# Patient Record
Sex: Male | Born: 1935 | Race: White | Hispanic: No | Smoking: Former smoker
Health system: Southern US, Community
[De-identification: ages and names within clinical notes are randomized; demographics above are authoritative.]

## PROBLEM LIST (undated history)

## (undated) DIAGNOSIS — R238 Other skin changes: Secondary | ICD-10-CM

## (undated) DIAGNOSIS — E785 Hyperlipidemia, unspecified: Secondary | ICD-10-CM

## (undated) DIAGNOSIS — I8390 Asymptomatic varicose veins of unspecified lower extremity: Secondary | ICD-10-CM

## (undated) DIAGNOSIS — D61818 Other pancytopenia: Principal | ICD-10-CM

## (undated) DIAGNOSIS — D469 Myelodysplastic syndrome, unspecified: Secondary | ICD-10-CM

## (undated) DIAGNOSIS — D4620 Refractory anemia with excess of blasts, unspecified: Principal | ICD-10-CM

## (undated) DIAGNOSIS — C61 Malignant neoplasm of prostate: Secondary | ICD-10-CM

## (undated) HISTORY — DX: Other skin changes: R23.8

## (undated) HISTORY — DX: Hyperlipidemia, unspecified: E78.5

## (undated) HISTORY — DX: Other pancytopenia: D61.818

## (undated) HISTORY — PX: PROSTATECTOMY: SHX69

## (undated) HISTORY — DX: Asymptomatic varicose veins of unspecified lower extremity: I83.90

## (undated) HISTORY — DX: Malignant neoplasm of prostate: C61

## (undated) HISTORY — DX: Refractory anemia with excess of blasts, unspecified: D46.20

---

## 1995-12-20 HISTORY — PX: PROSTATE SURGERY: SHX751

## 1999-04-26 ENCOUNTER — Ambulatory Visit (HOSPITAL_COMMUNITY): Admission: RE | Admit: 1999-04-26 | Discharge: 1999-04-26 | Payer: Self-pay

## 2004-07-03 ENCOUNTER — Encounter: Admission: RE | Admit: 2004-07-03 | Discharge: 2004-07-26 | Payer: Self-pay | Admitting: Orthopedic Surgery

## 2007-09-16 ENCOUNTER — Encounter: Admission: RE | Admit: 2007-09-16 | Discharge: 2007-09-16 | Payer: Self-pay | Admitting: Internal Medicine

## 2009-12-19 HISTORY — PX: HERNIA REPAIR: SHX51

## 2011-01-12 ENCOUNTER — Encounter (INDEPENDENT_AMBULATORY_CARE_PROVIDER_SITE_OTHER): Payer: Self-pay | Admitting: Surgery

## 2011-01-12 ENCOUNTER — Ambulatory Visit (INDEPENDENT_AMBULATORY_CARE_PROVIDER_SITE_OTHER): Payer: Medicare Other | Admitting: Surgery

## 2011-01-12 VITALS — BP 132/76 | HR 69 | Ht 69.0 in | Wt 157.4 lb

## 2011-01-12 DIAGNOSIS — K644 Residual hemorrhoidal skin tags: Secondary | ICD-10-CM

## 2011-01-12 MED ORDER — HYDROCODONE-ACETAMINOPHEN 10-325 MG PO TABS: 1.0000 | ORAL_TABLET | Freq: Four times a day (QID) | ORAL | Status: AC | PRN

## 2011-01-12 NOTE — Progress Notes (Signed)
Subjective:     Patient ID: Jason Stafford, male   DOB: 07-02-1928, 77 y.o.   MRN: [id number]  HPI Patient presents today due to skin tag. This is located in his anal canal. It has been present for a number of months. It is causing local irritation. It causes mild to moderate discomfort with bowel movements. He denies any bleeding.  Review of Systems  Constitutional: Negative.   Eyes: Negative.   Respiratory: Negative.   Cardiovascular: Negative.   Gastrointestinal: Negative.   Genitourinary: Negative.   Musculoskeletal: Negative.        Objective:   Physical Exam  Constitutional: He appears well-developed and well-nourished.  HENT:  Head: Normocephalic and atraumatic.  Nose: Nose normal.  Genitourinary:       Skin tag noted left anal canal. Mild irritation. No anal fissure or hemorrhoid disease. Rectal tone normal. No masses.       Assessment:     Anal skin tag    Plan:     The anal skin tag is bothering him. He wishes to have it removed. Risks include bleeding, infection, and recurrence. He understands and agrees. Under sterile conditions using one percent lidocaine skin tag was left anal canal was injected. This was excised with scissors. Dry dressing was applied. Hemostasis achieved. He will return as needed or if he develops fever or increasing pain. Care instructions given.

## 2011-01-12 NOTE — Patient Instructions (Signed)
Sitz baths 3 times per day.  Keep area dry with gauze.  Call if temp greater than 101.5 or increasing pain.  Baby wipes.

## 2012-01-17 ENCOUNTER — Encounter: Payer: Self-pay | Admitting: Oncology

## 2012-01-17 ENCOUNTER — Other Ambulatory Visit: Payer: Self-pay | Admitting: Oncology

## 2012-01-17 DIAGNOSIS — C61 Malignant neoplasm of prostate: Secondary | ICD-10-CM

## 2012-01-17 DIAGNOSIS — D61818 Other pancytopenia: Secondary | ICD-10-CM

## 2012-01-17 DIAGNOSIS — E785 Hyperlipidemia, unspecified: Secondary | ICD-10-CM | POA: Insufficient documentation

## 2012-01-17 HISTORY — DX: Malignant neoplasm of prostate: C61

## 2012-01-17 HISTORY — DX: Hyperlipidemia, unspecified: E78.5

## 2012-01-17 HISTORY — DX: Other pancytopenia: D61.818

## 2012-01-18 ENCOUNTER — Telehealth: Payer: Self-pay | Admitting: Oncology

## 2012-01-18 NOTE — Telephone Encounter (Signed)
S/w the pt and he is aware of his lab appt on 01/28/2012 °

## 2012-01-22 ENCOUNTER — Telehealth: Payer: Self-pay | Admitting: Oncology

## 2012-01-22 NOTE — Telephone Encounter (Signed)
S/w the pt and he is aware of his lab appt on 01/28/2012

## 2012-01-24 ENCOUNTER — Telehealth: Payer: Self-pay | Admitting: Oncology

## 2012-01-24 NOTE — Telephone Encounter (Signed)
S/W pt in re NP appt 10/02 @ 3 w/Dr. Cyndie Chime.  Referring Dr. Selena Batten Dx- Pancytopenia NP packet mailed.

## 2012-01-24 NOTE — Telephone Encounter (Signed)
C/D on 9/5 for appt 10/02

## 2012-01-28 ENCOUNTER — Other Ambulatory Visit (HOSPITAL_BASED_OUTPATIENT_CLINIC_OR_DEPARTMENT_OTHER): Payer: Medicare Other | Admitting: Lab

## 2012-01-28 DIAGNOSIS — D61818 Other pancytopenia: Secondary | ICD-10-CM

## 2012-01-28 LAB — COMPREHENSIVE METABOLIC PANEL (CC13)
AST: 22 U/L (ref 5–34)
Albumin: 3.7 g/dL (ref 3.5–5.0)
Alkaline Phosphatase: 72 U/L (ref 40–150)
BUN: 18 mg/dL (ref 7.0–26.0)
Creatinine: 0.8 mg/dL (ref 0.7–1.3)
Glucose: 86 mg/dl (ref 70–99)

## 2012-01-28 LAB — CBC & DIFF AND RETIC
Basophils Absolute: 0 10*3/uL (ref 0.0–0.1)
Eosinophils Absolute: 0 10*3/uL (ref 0.0–0.5)
HCT: 36.6 % — ABNORMAL LOW (ref 38.4–49.9)
HGB: 12.3 g/dL — ABNORMAL LOW (ref 13.0–17.1)
Immature Retic Fract: 14.9 % — ABNORMAL HIGH (ref 3.00–10.60)
LYMPH%: 32.1 % (ref 14.0–49.0)
MCV: 89.5 fL (ref 79.3–98.0)
MONO#: 0.5 10*3/uL (ref 0.1–0.9)
MONO%: 12 % (ref 0.0–14.0)
NEUT#: 2.1 10*3/uL (ref 1.5–6.5)
NEUT%: 54.9 % (ref 39.0–75.0)
Platelets: 84 10*3/uL — ABNORMAL LOW (ref 140–400)
RBC: 4.09 10*6/uL — ABNORMAL LOW (ref 4.20–5.82)

## 2012-01-28 LAB — MORPHOLOGY: PLT EST: DECREASED

## 2012-01-30 LAB — IMMUNOFIXATION ELECTROPHORESIS
IgA: 188 mg/dL (ref 68–379)
IgG (Immunoglobin G), Serum: 1030 mg/dL (ref 650–1600)

## 2012-01-30 LAB — ERYTHROPOIETIN: Erythropoietin: 36.9 m[IU]/mL — ABNORMAL HIGH (ref 2.6–34.0)

## 2012-01-30 LAB — KAPPA/LAMBDA LIGHT CHAINS
Kappa free light chain: 1.03 mg/dL (ref 0.33–1.94)
Lambda Free Lght Chn: 2.73 mg/dL — ABNORMAL HIGH (ref 0.57–2.63)

## 2012-01-30 LAB — FOLATE: Folate: >20 ng/mL

## 2012-01-30 LAB — VITAMIN B12: Vitamin B-12: 593 pg/mL (ref 211–911)

## 2012-02-07 ENCOUNTER — Other Ambulatory Visit: Payer: Self-pay | Admitting: Oncology

## 2012-02-20 ENCOUNTER — Telehealth: Payer: Self-pay | Admitting: Oncology

## 2012-02-20 ENCOUNTER — Ambulatory Visit (HOSPITAL_BASED_OUTPATIENT_CLINIC_OR_DEPARTMENT_OTHER): Payer: Medicare Other | Admitting: Oncology

## 2012-02-20 ENCOUNTER — Ambulatory Visit: Payer: Medicare Other

## 2012-02-20 ENCOUNTER — Encounter: Payer: Self-pay | Admitting: Oncology

## 2012-02-20 VITALS — BP 133/72 | Ht 69.0 in | Wt 156.0 lb

## 2012-02-20 DIAGNOSIS — D61818 Other pancytopenia: Secondary | ICD-10-CM

## 2012-02-20 DIAGNOSIS — D72821 Monocytosis (symptomatic): Secondary | ICD-10-CM

## 2012-02-20 NOTE — Progress Notes (Signed)
New Patient Hematology-Oncology Evaluation   Jason Stafford [id number] 09/25/1928 78 y.o. 02/20/2012  CC: Dr. Pearson Grippe   Reason for referral: Pancytopenia   HPI:  Pleasant man who will be 78 years old next week. He has been in overall excellent health without any major medical problems. He had a prostatectomy in 1997 for prostate cancer. He did not require any radiation treatments. His only medications are one baby aspirin once a week,  a multivitamin  and a calcium supplement.Marland Kitchen He drinks 2 glasses of red wine per week. He has no history of exposure to organic chemicals or therapeutic radiation. He feels great. He is very active. A routine CBC done through his primary care physician's office on August 27  when he complained of spontaneous bruising showed mild pancytopenia with hemoglobin 12.5 hematocrit 37 MCV 94 white count 4200, 54% neutrophils, 32% lymphocytes, 12% monocytes, 2 eosinophils, and platelet count 76,000. Lab was repeated in anticipation of today's visit on September 9 and shows similar values. In addition I checked a B12, folic acid, both normal at 409 and greater than 20 respectively.. Serum immunoglobulins with normal total IgG, IgA, and slightly decreased IgM. There is a monoclonal IgA lambda paraprotein on immunofixation electrophoresis. Serum  lambda free light chains minimally increased over normal at 2.73 mg percent normal up to 2.63 and this is insignificant. A serum erythropoietin level is 36.9 slightly above normal range up to 34. Chemistry profile shows a decreased serum total protein 6.1 with a normal albumin of 3.7. Liver functions otherwise normal. Renal function normal.   PMH: Past Medical History  Diagnosis Date  . Pancytopenia 01/17/2012    Hb 12.5, MCV 94, WBC 4,200 54 P, 32 L , 12 Mono, platelets 75,000  01/15/12  . Prostate ca 01/17/2012    Gleason 7, Rx prostatectomy Aug, 1997  . Hyperlipidemia 01/17/2012  No history of hypertension, MI, ulcers, asthma,  emphysema, hepatitis, yellow jaundice, thyroid trouble, seizures, stroke, blood clots, bleeding problems.  Past Surgical History  Procedure Date  . Prostate surgery aug 1997    cancer  . Hernia repair aug 2011    Allergies: Allergies  Allergen Reactions  . 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 Former smoker who stopped back in the 70s. Alcohol intake Limited to 2 glasses of wine weekly. He never drank heavily in the past. He is married and wife accompanies him today. Family History: Family History  Problem Relation Age of Onset  . Heart disease Mother   . Heart disease Father     Review of Systems: Constitutional symptoms:no constitutional symptoms HEENT: Respiratory: no cough or dyspnea Cardiovascular:  No chest pain or palpitations Gastrointestinal ROS: no change in bowel habit Genito-Urinary ROS: he has problems with urinary dribbling since prostate surgery. He required a urologic surgery at [facility] with placement of a mesh sling. Hematological and Lymphatic: Musculoskeletal:no muscle or bone pain Neurologic:no headache or change in vision Dermatologic:see above Remaining ROS negative.  Physical Exam: Blood pressure 133/72, height 5\' 9"  (1.753 m), weight 156 lb (70.761 kg). Wt Readings from Last 3 Encounters:  02/20/12 156 lb (70.761 kg)  01/12/11 157 lb 6.4 oz (71.396 kg)    General appearance:and well-nourished Caucasian man Head: normal, pharynx no erythema exudate or mass Neck: normal Lymph nodes:no adenopathy Breasts: Lungs:clinical auscultation resonant to percussion Heart:regular rhythm no murmur Abdominal:soft nontender no  mass no organomegaly GU:not examined Extremities:no edema no calf tenderness Neurologic:moderate decrease in vibration sensation over the fingertips by tuning fork exam Skin:some tiny 3-4 mm  bruises on his left arm    Lab Results: Lab Results  Component Value Date   WBC 3.8* 01/28/2012   HGB 12.3* 01/28/2012   HCT 36.6* 01/28/2012   MCV 89.5 01/28/2012   PLT 84* 01/28/2012     Chemistry      Component Value Date/Time   NA 142 01/28/2012 1501   K 4.0 01/28/2012 1501   CL 107 01/28/2012 1501   CO2 28 01/28/2012 1501   BUN 18.0 01/28/2012 1501   CREATININE 0.8 01/28/2012 1501      Component Value Date/Time   CALCIUM 8.5 01/28/2012 1501   ALKPHOS 72 01/28/2012 1501   AST 22 01/28/2012 1501   ALT 17 01/28/2012 1501   BILITOT 0.50 01/28/2012 1501       Review of peripheral blood film:normochromic normocytic red cells. Neutrophils and lymphocytes are mature. Normal platelets. No early myeloid form    Impression and Plan: Pancytopenia with monocytosis in an otherwise healthy man on no major medications.  Most likely diagnosis is a myelodysplastic syndrome. Multiple myeloma not excluded but less likely.  Plan:  I discussed the  Blood findings in detail with the patient his wife and gave them written notes as we talked. He has agreed to a bone marrow aspiration and biopsy which I will do on October 17 as an outpatient. We discussed the fact that if this is a myelodysplastic syndrome, bone marrow yields important information. Based on percentage of blasts and any chromosome changes we can stratify patients into low,  Intermediate, or high risk for progression of pancytopenia and the development of acute leukemia. We discussed potential treatments for each of these categories. If he is in the low risk category, observation alone would be the treatment of choice.      Levert Feinstein, MD 02/20/2012, 4:57 PM

## 2012-02-20 NOTE — Telephone Encounter (Signed)
gv and printed appt for pt......Jason Kitchenemailed MW and LT to add appt for pt bone marrow biopsy 10.17.13 @ 9:00 am.

## 2012-02-20 NOTE — Progress Notes (Signed)
Checked in new pt with no financial concerns. °

## 2012-02-21 ENCOUNTER — Telehealth: Payer: Self-pay | Admitting: Oncology

## 2012-02-21 NOTE — Telephone Encounter (Signed)
sw pt's wife and advised on 10.17.13 and 10.29.13 appts...Marland KitchenMarland KitchenPA LT added Bone marrow appt @ 9:00....sed

## 2012-02-28 ENCOUNTER — Telehealth: Payer: Self-pay | Admitting: *Deleted

## 2012-02-28 NOTE — Telephone Encounter (Signed)
Pt called & states that he wants to cancel his BMBX appt for 03/06/12 but will keep 03/18/12 appt with Dr. Cyndie Chime.  He feels that he needs to discuss this more with the doctor.  Cancelled appt with infusion/Michelle & called [name] & he didn't have him on their list.  Dr. Cyndie Chime will be notified.

## 2012-03-06 ENCOUNTER — Other Ambulatory Visit: Payer: Medicare Other | Admitting: Lab

## 2012-03-18 ENCOUNTER — Ambulatory Visit (HOSPITAL_BASED_OUTPATIENT_CLINIC_OR_DEPARTMENT_OTHER): Payer: Medicare Other | Admitting: Oncology

## 2012-03-18 ENCOUNTER — Other Ambulatory Visit (HOSPITAL_BASED_OUTPATIENT_CLINIC_OR_DEPARTMENT_OTHER): Payer: Medicare Other

## 2012-03-18 VITALS — BP 113/71 | HR 72 | Temp 97.7°F | Resp 18 | Ht 69.0 in | Wt 162.2 lb

## 2012-03-18 DIAGNOSIS — D72821 Monocytosis (symptomatic): Secondary | ICD-10-CM

## 2012-03-18 DIAGNOSIS — D61818 Other pancytopenia: Secondary | ICD-10-CM

## 2012-03-18 LAB — CBC WITH DIFFERENTIAL/PLATELET
Basophils Absolute: 0 10*3/uL (ref 0.0–0.1)
Eosinophils Absolute: 0.1 10*3/uL (ref 0.0–0.5)
HGB: 11.2 g/dL — ABNORMAL LOW (ref 13.0–17.1)
MCV: 89 fL (ref 79.3–98.0)
MONO#: 0.3 10*3/uL (ref 0.1–0.9)
NEUT#: 1.7 10*3/uL (ref 1.5–6.5)
Platelets: 98 10*3/uL — ABNORMAL LOW (ref 140–400)
RBC: 3.72 10*6/uL — ABNORMAL LOW (ref 4.20–5.82)
RDW: 20.6 % — ABNORMAL HIGH (ref 11.0–14.6)
WBC: 3.5 10*3/uL — ABNORMAL LOW (ref 4.0–10.3)

## 2012-03-18 NOTE — Patient Instructions (Signed)
Bone marrow biopsy at [facility]  Arrive at 8:30 AM for lab, 9 AM for procedure on Thursday, November 14 Take ativan 1 mg 30-45 minutes before procedure OK to have a light breakfast Meet with MD to discuss results on Friday, 11/29 @ 4 PM

## 2012-03-18 NOTE — Progress Notes (Signed)
Short interim followup visit for this pleasant 78 year old man I recently evaluated for unexplained pancytopenia. Please see my October 2 office consult note for full details. Screen for multiple myeloma was negative. B12 and folic acid levels normal. He had a mild monocytosis. My working diagnosis was a myelodysplastic syndrome. I recommended a bone marrow aspiration and biopsy. I scheduled this but then he called and canceled it. He had more questions and came in with his wife today for further discussion. We again reviewed the fact that myelodysplastic syndrome is a diagnosis that includes many different bone marrow disorders. It can be best characterized by risk categories of low, intermediate, and high with respect to progressive cytopenias and incidence of conversion to acute leukemia. The only way to get this information is to look at percent blasts in the bone marrow and chromosome changes. Low risk MDS is treated with observation alone. For isolated anemia, ESAs can be used with good results in many patients. For intermediate risk patients especially those with the 5Q chromosome deletion as a single cytogenetic abnormality, Revlimid can be used with good results. For high risk patients, we typically use a hypomethylating agent such as 5-azacytidine which is given by subcutaneous injection 7 days each month. He was particularly interested in side effects and quality of life issues and I tried to answer his questions as best as I could reminding him that I really need a firm diagnosis to make any specific treatment recommendations.  He and his wife have a much better understanding today. We concluded with him rescheduling a bone marrow biopsy which I will do on November 14.

## 2012-03-19 ENCOUNTER — Telehealth: Payer: Self-pay | Admitting: Oncology

## 2012-03-19 ENCOUNTER — Telehealth: Payer: Self-pay | Admitting: *Deleted

## 2012-03-19 ENCOUNTER — Encounter: Payer: Self-pay | Admitting: *Deleted

## 2012-03-19 NOTE — Progress Notes (Signed)
Bone Marrow BX scheduled for Thursday 04/04/11.  8:30am lab/9am Infusion Marcelino Duster).   Scheduled with Claris Che in Flow [phone].

## 2012-03-19 NOTE — Telephone Encounter (Signed)
lmonvm for pt confirming appts for 11/14 and 11/29.

## 2012-03-19 NOTE — Telephone Encounter (Signed)
Patient confirmed over the phone the new date and time on 04-18-2012 at 11:30am

## 2012-03-21 ENCOUNTER — Telehealth: Payer: Self-pay | Admitting: Oncology

## 2012-03-21 ENCOUNTER — Other Ambulatory Visit: Payer: Self-pay | Admitting: *Deleted

## 2012-03-21 NOTE — Telephone Encounter (Signed)
Pt called and wanted to move appt to the afternoon. Moved appt to 4:30.

## 2012-04-03 ENCOUNTER — Ambulatory Visit (HOSPITAL_BASED_OUTPATIENT_CLINIC_OR_DEPARTMENT_OTHER): Payer: Medicare Other | Admitting: Oncology

## 2012-04-03 ENCOUNTER — Other Ambulatory Visit: Payer: Medicare Other | Admitting: Lab

## 2012-04-03 ENCOUNTER — Other Ambulatory Visit (HOSPITAL_COMMUNITY)
Admission: RE | Admit: 2012-04-03 | Discharge: 2012-04-03 | Disposition: A | Discharge status: Home / Self Care | Payer: Medicare Other | Source: Ambulatory Visit | Attending: Oncology | Admitting: Oncology

## 2012-04-03 VITALS — BP 117/66 | HR 77 | Temp 97.5°F

## 2012-04-03 DIAGNOSIS — D61818 Other pancytopenia: Secondary | ICD-10-CM

## 2012-04-03 LAB — DIFFERENTIAL
Basophils Relative: 0 % (ref 0–1)
Eosinophils Relative: 1 % (ref 0–5)
Lymphs Abs: 1.2 10*3/uL (ref 0.7–4.0)
Monocytes Absolute: 0.3 10*3/uL (ref 0.1–1.0)
Monocytes Relative: 9 % (ref 3–12)
Neutro Abs: 1.5 10*3/uL — ABNORMAL LOW (ref 1.7–7.7)

## 2012-04-03 LAB — CBC
HCT: 35.2 % — ABNORMAL LOW (ref 39.0–52.0)
Hemoglobin: 11.5 g/dL — ABNORMAL LOW (ref 13.0–17.0)
MCH: 28.4 pg (ref 26.0–34.0)
MCV: 86.9 fL (ref 78.0–100.0)
RBC: 4.05 MIL/uL — ABNORMAL LOW (ref 4.22–5.81)
WBC: 3 10*3/uL — ABNORMAL LOW (ref 4.0–10.5)

## 2012-04-03 NOTE — Progress Notes (Signed)
Bone Marrow Biopsy and Aspiration Procedure Note   Informed consent was obtained and potential risks including bleeding, infection and pain were reviewed with the patient.  Posterior iliac crest(s) prepped with Betadine.  Lidocaine 2% local anesthesia infiltrated into the subcutaneous tissue. Premedication: PO lorazepam 1 mg  Left bone marrow biopsy and  aspirate was obtained.   The procedure was tolerated well and there were no complications.  Specimens sent for: routine histopathologic stains and sectioning,  and cytogenetics  Indication:  Evaluate pancytopenia  Physician: Levert Feinstein

## 2012-04-03 NOTE — Patient Instructions (Addendum)
[facility] Discharge Instructions for Post Bone Marrow Procedure  Today you had a bone marrow biopsy and aspirate of left hip.   Please keep the pressure dressing in place for at least 24 hours.  Have someone check your dressing periodically for bleeding.  If needed you can reapply a pressure dressing to the site.  Take pain medication as ordered per MD.  IF BLEEDING REOCCURS THAT SHOULD BE REPORTED IMMEDIATELY. Call the Cancer Center at [phone] if during business hours. Or report to the Emergency Room.   I have been informed and understand all the instructions given to me. I know to contact the clinic, my physician, or go to the Emergency Department if any problems should occur. I do not have any questions at this time, but understand that I may call the clinic during office hours at [phone]  should I have any questions or need assistance in obtaining follow up care.    __________________________________________  _____________  __________ Signature of Patient or Authorized Representative            Date                   Time    __________________________________________ Nurse's Signature     

## 2012-04-03 NOTE — Progress Notes (Signed)
0930-BM biopsy complete to left hip per Dr. Cyndie Chime.  VS stable.  Dressing clean, dry and intact to left hip.  Teach back complete regarding BM biopsy discharge instructions.  Pt with no questions at this time.

## 2012-04-08 ENCOUNTER — Other Ambulatory Visit: Payer: Self-pay | Admitting: *Deleted

## 2012-04-08 MED ORDER — LORAZEPAM 1 MG PO TABS: ORAL_TABLET | ORAL | Status: DC

## 2012-04-18 ENCOUNTER — Ambulatory Visit (HOSPITAL_BASED_OUTPATIENT_CLINIC_OR_DEPARTMENT_OTHER): Payer: Medicare Other | Admitting: Oncology

## 2012-04-18 ENCOUNTER — Encounter: Payer: Self-pay | Admitting: Oncology

## 2012-04-18 VITALS — BP 124/65 | HR 75 | Temp 98.4°F | Resp 18 | Ht 69.0 in | Wt 161.1 lb

## 2012-04-18 DIAGNOSIS — D61818 Other pancytopenia: Secondary | ICD-10-CM

## 2012-04-18 DIAGNOSIS — D46Z Other myelodysplastic syndromes: Secondary | ICD-10-CM

## 2012-04-18 DIAGNOSIS — D4620 Refractory anemia with excess of blasts, unspecified: Secondary | ICD-10-CM

## 2012-04-18 HISTORY — DX: Other myelodysplastic syndromes: D46.Z

## 2012-04-18 HISTORY — DX: Refractory anemia with excess of blasts, unspecified: D46.20

## 2012-04-18 NOTE — Progress Notes (Signed)
Mr. Allinson here with his wife today to discuss results of bone marrow biopsy I did last week. He is under evaluation for pancytopenia.  Bone marrow biopsy is hypercellular for age with trilineage dyspoiesis although this is mild in the erythroid series and myeloid series but more pronounced in the megakaryocyte line. 500 cell count shows 5% blasts which is upper normal. Cytogenetic studies including routine chromosomes and FISH analysis for chromosome 5 and chromosomes 7 abnormalities are normal.  We discussed the international prognostic scoring system. I gave him extensive printed information about myelodysplastic syndrome. At present, he has a low risk score. This would predict a very low conversion to acute leukemia on the order of 25% in the next 11 years.  Impression: Low risk myelodysplastic syndrome.   Recommendation: I wanted to check lab every 2 months but he would prefer to check every 3 months. We will check again after the new year. If any significant trend for deterioration in his counts, I will start him on oral Revlimid 10 mg daily. We discussed this drug today along with potential side effects. There is about a 30% chance it would work in people who do not have an isolated 5Q minus syndrome where the response rates are 50% or higher. He is not a candidate for more aggressive therapy at this point in time.  The entire visit was spent with face-to-face consultation with the patient and his wife. Physical exam not repeated today nor necessary.  He did develop a significant hematoma at the site of the bone marrow biopsy. He does not take aspirin. Despite the fact that his platelet count is currently 122,000, it appears that his platelets are dysfunctional. He also showed me some spontaneous ecchymoses on his left forearm. He is advised to stay away from aspirin products.   Cc: Dr. Pearson Grippe; Dr. Harriette Bouillon

## 2012-04-18 NOTE — Patient Instructions (Signed)
Blood tests every 3 months next 06/03/12 Dr visit April 14 @ 3:30 PM Start B complex vitamin 1 daily

## 2012-04-21 ENCOUNTER — Telehealth: Payer: Self-pay | Admitting: Oncology

## 2012-04-21 NOTE — Telephone Encounter (Signed)
lvm for pt regarding Jan 2014 appt schedule....mailed Jan 2014 appt schedule for pt.

## 2012-06-03 ENCOUNTER — Other Ambulatory Visit (HOSPITAL_BASED_OUTPATIENT_CLINIC_OR_DEPARTMENT_OTHER): Payer: Medicare Other | Admitting: Lab

## 2012-06-03 DIAGNOSIS — D4620 Refractory anemia with excess of blasts, unspecified: Secondary | ICD-10-CM

## 2012-06-03 DIAGNOSIS — D61818 Other pancytopenia: Secondary | ICD-10-CM

## 2012-06-03 LAB — CBC WITH DIFFERENTIAL/PLATELET
Basophils Absolute: 0 10*3/uL (ref 0.0–0.1)
EOS%: 0.3 % (ref 0.0–7.0)
HCT: 32.3 % — ABNORMAL LOW (ref 38.4–49.9)
HGB: 10.2 g/dL — ABNORMAL LOW (ref 13.0–17.1)
LYMPH%: 42.7 % (ref 14.0–49.0)
MCH: 26.4 pg — ABNORMAL LOW (ref 27.2–33.4)
MCV: 83.5 fL (ref 79.3–98.0)
NEUT%: 51.3 % (ref 39.0–75.0)
Platelets: 107 10*3/uL — ABNORMAL LOW (ref 140–400)
lymph#: 1.1 10*3/uL (ref 0.9–3.3)

## 2012-06-03 LAB — MORPHOLOGY: Blasts: 2 % (ref 0–0)

## 2012-06-09 ENCOUNTER — Other Ambulatory Visit: Payer: Self-pay | Admitting: Oncology

## 2012-06-09 DIAGNOSIS — D61818 Other pancytopenia: Secondary | ICD-10-CM

## 2012-06-09 DIAGNOSIS — D4620 Refractory anemia with excess of blasts, unspecified: Secondary | ICD-10-CM

## 2012-06-09 NOTE — Progress Notes (Signed)
Progressive pancytopenia. Lab noted 2 blasts on smear. I reviewed and could not confirm . I spoke w pt wife - I would like to repeat labs here next week on 1/28.

## 2012-06-17 ENCOUNTER — Other Ambulatory Visit: Payer: Medicare Other

## 2012-06-19 ENCOUNTER — Other Ambulatory Visit: Payer: Self-pay | Admitting: *Deleted

## 2012-06-19 DIAGNOSIS — D61818 Other pancytopenia: Secondary | ICD-10-CM

## 2012-06-19 MED ORDER — FA-PYRIDOXINE-CYANOCOBALAMIN 2.5-25-2 MG PO TABS: 1.0000 | ORAL_TABLET | Freq: Every day | ORAL | Status: DC

## 2012-08-03 ENCOUNTER — Emergency Department (HOSPITAL_BASED_OUTPATIENT_CLINIC_OR_DEPARTMENT_OTHER): Payer: Medicare Other

## 2012-08-03 ENCOUNTER — Encounter (HOSPITAL_BASED_OUTPATIENT_CLINIC_OR_DEPARTMENT_OTHER): Payer: Self-pay

## 2012-08-03 ENCOUNTER — Emergency Department (HOSPITAL_BASED_OUTPATIENT_CLINIC_OR_DEPARTMENT_OTHER)
Admission: EM | Admit: 2012-08-03 | Discharge: 2012-08-03 | Disposition: A | Discharge status: Home / Self Care | Payer: Medicare Other | Attending: Emergency Medicine | Admitting: Emergency Medicine

## 2012-08-03 DIAGNOSIS — D61818 Other pancytopenia: Secondary | ICD-10-CM | POA: Insufficient documentation

## 2012-08-03 DIAGNOSIS — Z862 Personal history of diseases of the blood and blood-forming organs and certain disorders involving the immune mechanism: Secondary | ICD-10-CM | POA: Insufficient documentation

## 2012-08-03 DIAGNOSIS — E785 Hyperlipidemia, unspecified: Secondary | ICD-10-CM | POA: Insufficient documentation

## 2012-08-03 DIAGNOSIS — Z8546 Personal history of malignant neoplasm of prostate: Secondary | ICD-10-CM | POA: Insufficient documentation

## 2012-08-03 DIAGNOSIS — T148XXA Other injury of unspecified body region, initial encounter: Secondary | ICD-10-CM

## 2012-08-03 DIAGNOSIS — Y929 Unspecified place or not applicable: Secondary | ICD-10-CM | POA: Insufficient documentation

## 2012-08-03 DIAGNOSIS — X58XXXA Exposure to other specified factors, initial encounter: Secondary | ICD-10-CM | POA: Insufficient documentation

## 2012-08-03 DIAGNOSIS — Y939 Activity, unspecified: Secondary | ICD-10-CM | POA: Insufficient documentation

## 2012-08-03 DIAGNOSIS — S9030XA Contusion of unspecified foot, initial encounter: Secondary | ICD-10-CM | POA: Insufficient documentation

## 2012-08-03 DIAGNOSIS — Z79899 Other long term (current) drug therapy: Secondary | ICD-10-CM | POA: Insufficient documentation

## 2012-08-03 DIAGNOSIS — Z87891 Personal history of nicotine dependence: Secondary | ICD-10-CM | POA: Insufficient documentation

## 2012-08-03 LAB — BASIC METABOLIC PANEL
Calcium: 9.3 mg/dL (ref 8.4–10.5)
Creatinine, Ser: 0.8 mg/dL (ref 0.50–1.35)
GFR calc non Af Amer: 82 mL/min — ABNORMAL LOW (ref >90)
Glucose, Bld: 99 mg/dL (ref 70–99)
Sodium: 142 mEq/L (ref 135–145)

## 2012-08-03 LAB — CBC WITH DIFFERENTIAL/PLATELET
Eosinophils Absolute: 0 10*3/uL (ref 0.0–0.7)
Eosinophils Relative: 0 % (ref 0–5)
Lymphs Abs: 0.6 10*3/uL — ABNORMAL LOW (ref 0.7–4.0)
MCH: 25.1 pg — ABNORMAL LOW (ref 26.0–34.0)
MCV: 81.3 fL (ref 78.0–100.0)
Platelets: 153 10*3/uL (ref 150–400)
RBC: 4.11 MIL/uL — ABNORMAL LOW (ref 4.22–5.81)
RDW: 17.8 % — ABNORMAL HIGH (ref 11.5–15.5)

## 2012-08-03 NOTE — ED Notes (Signed)
ACE wrap applied per Dr. Bernette Mayers.  CMS present pre and post application.

## 2012-08-03 NOTE — ED Notes (Signed)
Pt states that his R foot has been swelling since Wednesday.  Pt took Aleve for discomfort but it has not helped with the symptoms at all. Pt has hx of bone marrow disorder.

## 2012-08-03 NOTE — ED Provider Notes (Signed)
History     CSN: [id number]  Arrival date & time 08/03/12  1014   First MD Initiated Contact with Patient 08/03/12 1026      Chief Complaint  Patient presents with  . Foot Swelling    (Consider location/radiation/quality/duration/timing/severity/associated sxs/prior treatment) HPI Pt with history of myelodysplastic syndrome and pancytopenia reports pain and swelling in R foot for the last 3-4 days. No known injury but he noticed some bruising and swelling to the outside of his R foot. Worse at night, improved in the AM. Moderate aching pain when walking, otherwise no significant pain. No fever. No prior history of gout. He has had spontaneous bruising to other areas of his body including abdominal wall and R arm related to thrombocytopenia.   Past Medical History  Diagnosis Date  . Pancytopenia 01/17/2012    Hb 12.5, MCV 94, WBC 4,200 54 P, 32 L , 12 Mono, platelets 75,000  01/15/12  . Prostate ca 01/17/2012    Gleason 7, Rx prostatectomy Aug, 1997  . Hyperlipidemia 01/17/2012  . MDS (myelodysplastic syndrome), low grade 04/18/2012    Bone marrow biopsy 04/03/12: 50%-60% cellularity. Mild dyserythropoiesis. Left shift. Significant dysmegakaryopoiesis. 5% blasts. Normal karyotype. Probes for chromosomes 5 and 7 by [facility] analysis showed no deletions. IPSS revised criteria: low risk  5.3 years median survivial ( 2 points for 5% blasts, 0 points for each of following:  Hemoglobin >10; ANC>1000, platelets>100,000; normal chromosomes)    Past Surgical History  Procedure Laterality Date  . Prostate surgery  aug 1997    cancer  . Hernia repair  aug 2011    Family History  Problem Relation Age of Onset  . Heart disease Mother   . Heart disease Father     History  Substance Use Topics  . Smoking status: Former Smoker    Quit date: 01/12/1979  . Smokeless tobacco: Not on file  . Alcohol Use: 1.2 oz/week    2 Glasses of wine per week     Comment: weekly      Review of  Systems All other systems reviewed and are negative except as noted in HPI.   Allergies  Protonix  Home Medications   Current Outpatient Rx  Name  Route  Sig  Dispense  Refill  . Calcium Carb-Cholecalciferol (OS-CAL) 500-600 MG-UNIT CHEW   Oral   Chew 1 tablet by mouth daily.         Marland Kitchen COENZYME Q10-RED YEAST RICE PO   Oral   Take 1 tablet by mouth daily. Red yeast 600mg  & 100mg  CQ10         . folic acid-pyridoxine-cyancobalamin (FOLTX) 2.5-25-2 MG TABS   Oral   Take 1 tablet by mouth daily.   100 each   3     Script given to pt 04/18/12 for foltx vitamin B co ...   . Multiple Vitamins-Minerals (MULTIVITAMIN WITH MINERALS) tablet   Oral   Take 1 tablet by mouth daily.             BP 146/72  Pulse 74  Temp(Src) 97.7 F (36.5 C) (Oral)  Resp 19  Ht 5\' 9"  (1.753 m)  Wt 160 lb (72.576 kg)  BMI 23.62 kg/m2  SpO2 100%  Physical Exam  Nursing note and vitals reviewed. Constitutional: He is oriented to person, place, and time. He appears well-developed and well-nourished.  HENT:  Head: Normocephalic and atraumatic.  Eyes: EOM are normal. Pupils are equal, round, and reactive to light.  Neck: Normal range  of motion. Neck supple.  Cardiovascular: Normal rate, normal heart sounds and intact distal pulses.   Pulmonary/Chest: Effort normal and breath sounds normal.  Abdominal: Bowel sounds are normal. He exhibits no distension. There is no tenderness.  Superficial bruise to anterior abdominal wall, pt states appeared about a month ago  Musculoskeletal: Normal range of motion. He exhibits edema and tenderness.  Soft tissue swelling and ecchymosis to R lateral foot; no bony tenderness, no warmth or induration  Neurological: He is alert and oriented to person, place, and time. He has normal strength. No cranial nerve deficit or sensory deficit.  Skin: Skin is warm and dry. No rash noted.  Psychiatric: He has a normal mood and affect.    ED Course  Procedures  (including critical care time)  Labs Reviewed  CBC WITH DIFFERENTIAL - Abnormal; Notable for the following:    WBC 2.0 (*)    RBC 4.11 (*)    Hemoglobin 10.3 (*)    HCT 33.4 (*)    MCH 25.1 (*)    RDW 17.8 (*)    Neutro Abs 0.9 (*)    Lymphs Abs 0.6 (*)    Basophils Relative 4 (*)    All other components within normal limits  BASIC METABOLIC PANEL - Abnormal; Notable for the following:    GFR calc non Af Amer 82 (*)    All other components within normal limits  URIC ACID   Dg Ankle Complete Right  08/03/2012  *RADIOLOGY REPORT*  Clinical Data: Pain and swelling for several days.  RIGHT ANKLE - COMPLETE 3+ VIEW  Comparison: None  Findings:  Ankle joint itself appears normal.  No evidence of fracture.  No other focal lesion.  IMPRESSION: Negative for   Original Report Authenticated By: Paulina Fusi, M.D.    Dg Foot Complete Right  08/03/2012  *RADIOLOGY REPORT*  Clinical Data: Pain and swelling for several days of  RIGHT FOOT COMPLETE - 3+ VIEW  Comparison: None.  Findings: There is hallux valgus deformity with mild degenerative change.  The other bones of the foot appear normal.  No traumatic finding.  No evidence of erosions.  IMPRESSION: Hallux valgus deformity.  No other finding of note.   Original Report Authenticated By: Paulina Fusi, M.D.      1. Bruise   2. Pancytopenia       MDM  Labs and xrays as above. No significant thrombocytopenia. Xray transcription incomplete, but no signs of fracture. Advised ACE wrap. Elevation. PCP followup.         Corbyn B. Bernette Mayers, MD 08/03/12 [id number]

## 2012-08-11 ENCOUNTER — Other Ambulatory Visit (HOSPITAL_BASED_OUTPATIENT_CLINIC_OR_DEPARTMENT_OTHER): Payer: Self-pay | Admitting: Family Medicine

## 2012-08-19 ENCOUNTER — Telehealth: Payer: Self-pay | Admitting: *Deleted

## 2012-08-19 NOTE — Telephone Encounter (Signed)
  Received call from pt stating that his ortho, Dr. Ranell Patrick has given him some meds for gout:  Indomethicine, colcrys, & allopurinol & he wants to make sure there aren't any contraindications with his FOLTX.  Checked with Daron Offer D & she states that the colcrys can interfere with the absorption of B12 with long term use.  Reported to pt & he states this should be short term.

## 2012-08-26 ENCOUNTER — Other Ambulatory Visit: Payer: Medicare Other | Admitting: Lab

## 2012-09-01 ENCOUNTER — Ambulatory Visit (HOSPITAL_BASED_OUTPATIENT_CLINIC_OR_DEPARTMENT_OTHER): Payer: Medicare Other | Admitting: Oncology

## 2012-09-01 ENCOUNTER — Other Ambulatory Visit (HOSPITAL_BASED_OUTPATIENT_CLINIC_OR_DEPARTMENT_OTHER): Payer: Medicare Other | Admitting: Lab

## 2012-09-01 ENCOUNTER — Telehealth: Payer: Self-pay | Admitting: Oncology

## 2012-09-01 VITALS — BP 112/67 | HR 67 | Temp 97.3°F | Ht 69.0 in | Wt 158.1 lb

## 2012-09-01 DIAGNOSIS — D61818 Other pancytopenia: Secondary | ICD-10-CM

## 2012-09-01 DIAGNOSIS — D4620 Refractory anemia with excess of blasts, unspecified: Secondary | ICD-10-CM

## 2012-09-01 DIAGNOSIS — D46Z Other myelodysplastic syndromes: Secondary | ICD-10-CM

## 2012-09-01 LAB — CBC WITH DIFFERENTIAL/PLATELET
BASO%: 0 % (ref 0.0–2.0)
Basophils Absolute: 0 10*3/uL (ref 0.0–0.1)
HCT: 33 % — ABNORMAL LOW (ref 38.4–49.9)
LYMPH%: 48.7 % (ref 14.0–49.0)
MCHC: 30 g/dL — ABNORMAL LOW (ref 32.0–36.0)
MONO#: 0.1 10*3/uL (ref 0.1–0.9)
NEUT%: 48.6 % (ref 39.0–75.0)
Platelets: 152 10*3/uL (ref 140–400)
WBC: 2.3 10*3/uL — ABNORMAL LOW (ref 4.0–10.3)

## 2012-09-01 LAB — MORPHOLOGY
Blasts: 3 % (ref 0–0)
PLT EST: ADEQUATE

## 2012-09-01 NOTE — Telephone Encounter (Signed)
gv and printed appt sched. and avs for pt.Marland KitchenMarland KitchenDone

## 2012-09-02 NOTE — Progress Notes (Signed)
[name]  Jason Stafford [id number] Aug 08, 1928 79 y.o. 09/02/2012 7:30 PM   Principle Diagnosis: Encounter Diagnoses  Name Primary?  . Pancytopenia Yes  . MDS (myelodysplastic syndrome), low grade      Interim History:   Followup visit for this pleasant 79 year old man referred to this office for evaluation of pancytopenia. He agreed to a bone marrow biopsy which was done in November 2013. Marrow was hypercellular with trilineage dyspoiesis most pronounced in the megakaryocyte cell line. Blast percentage upper limit of normal at 5%. Cytogenetic studies including FISH analysis for chromosome 5 and 7 abnormalities was normal. He was given a diagnosis of low risk myelodysplastic syndrome. I have been following blood counts on an every three-month basis. White count differentials have shown up to 3% blasts in the peripheral blood but hemoglobin counts, total white count, and platelet count have been relatively stable. I did put him on a multivitamin including B12, B6, and folic acid.  He got through the winter without any infections. He has had no other interim medical problems.   Medications: reviewed  Allergies:  Allergies  Allergen Reactions  . Protonix (Pantoprazole Sodium) Diarrhea    Review of Systems: Constitutional:   No constitutional symptoms Respiratory: No cough or dyspnea Cardiovascular:  No chest pain or palpitations Gastrointestinal: No abdominal pain or change in bowel habit Genito-Urinary: No urinary tract symptoms Musculoskeletal: No muscle bone or joint pain Neurologic: No headache or change in vision Skin: No rash or ecchymosis Remaining ROS negative.  Physical Exam: Blood pressure 112/67, pulse 67, temperature 97.3 F (36.3 C), temperature source Oral, height 5\' 9"  (1.753 m), weight 158 lb 1.6 oz (71.714 kg). Wt Readings from Last 3 Encounters:  09/01/12 158 lb 1.6 oz (71.714 kg)  08/03/12 160 lb (72.576 kg)  04/18/12 161  lb 1.6 oz (73.074 kg)     General appearance: Thin Caucasian [name]: Pharynx no erythema or exudate Lymph nodes: No adenopathy Breasts: Lungs: Clear to auscultation resonant to percussion Heart: Regular rhythm, no murmur Abdomen: Soft, nontender, no mass, no organomegaly Extremities: No edema, no calf tenderness Vascular: No cyanosis Neurologic: No focal deficit Skin: No rash or ecchymosis  Lab Results: Lab Results  Component Value Date   WBC 2.3* 09/01/2012   HGB 9.9* 09/01/2012   HCT 33.0* 09/01/2012   MCV 80.9 09/01/2012   PLT 152 09/01/2012  White count differential: 39% neutrophils, 49% lymphocytes, 3% monocytes, 3% blasts   Chemistry      Component Value Date/Time   NA 142 08/03/2012 1115   NA 142 01/28/2012 1501   K 4.2 08/03/2012 1115   K 4.0 01/28/2012 1501   CL 106 08/03/2012 1115   CL 107 01/28/2012 1501   CO2 27 08/03/2012 1115   CO2 28 01/28/2012 1501   BUN 16 08/03/2012 1115   BUN 18.0 01/28/2012 1501   CREATININE 0.80 08/03/2012 1115   CREATININE 0.8 01/28/2012 1501      Component Value Date/Time   CALCIUM 9.3 08/03/2012 1115   CALCIUM 8.5 01/28/2012 1501   ALKPHOS 72 01/28/2012 1501   AST 22 01/28/2012 1501   ALT 17 01/28/2012 1501   BILITOT 0.50 01/28/2012 1501      Impression and Plan: Low risk myelodysplastic syndrome. This being said, I am uncomfortable with the presence of blasts in the peripheral blood. I want to check his blood counts every other month but he would prefer to keep it at every 3 months. If he does  convert to acute leukemia, treatment options would be very limited at his age.   CC:.    Levert Feinstein, MD 4/15/20147:30 PM

## 2012-09-04 ENCOUNTER — Other Ambulatory Visit: Payer: Self-pay

## 2012-09-04 DIAGNOSIS — D61818 Other pancytopenia: Secondary | ICD-10-CM

## 2012-09-04 MED ORDER — FA-PYRIDOXINE-CYANOCOBALAMIN 2.5-25-2 MG PO TABS: 1.0000 | ORAL_TABLET | Freq: Every day | ORAL | Status: DC

## 2012-09-23 ENCOUNTER — Other Ambulatory Visit: Payer: Self-pay | Admitting: Internal Medicine

## 2012-09-23 DIAGNOSIS — R609 Edema, unspecified: Secondary | ICD-10-CM

## 2012-09-24 ENCOUNTER — Ambulatory Visit
Admission: RE | Admit: 2012-09-24 | Discharge: 2012-09-24 | Disposition: A | Discharge status: Home / Self Care | Payer: Medicare Other | Source: Ambulatory Visit | Attending: Internal Medicine | Admitting: Internal Medicine

## 2012-09-24 DIAGNOSIS — R609 Edema, unspecified: Secondary | ICD-10-CM

## 2012-11-24 ENCOUNTER — Other Ambulatory Visit (HOSPITAL_BASED_OUTPATIENT_CLINIC_OR_DEPARTMENT_OTHER): Payer: Medicare Other | Admitting: Lab

## 2012-11-24 DIAGNOSIS — D61818 Other pancytopenia: Secondary | ICD-10-CM

## 2012-11-24 DIAGNOSIS — D4620 Refractory anemia with excess of blasts, unspecified: Secondary | ICD-10-CM

## 2012-11-24 LAB — MORPHOLOGY

## 2012-11-24 LAB — CBC WITH DIFFERENTIAL/PLATELET
BASO%: 0.6 % (ref 0.0–2.0)
EOS%: 0.2 % (ref 0.0–7.0)
MCHC: 30.5 g/dL — ABNORMAL LOW (ref 32.0–36.0)
MONO#: 0 10*3/uL — ABNORMAL LOW (ref 0.1–0.9)
RBC: 4.02 10*6/uL — ABNORMAL LOW (ref 4.20–5.82)
RDW: 17.6 % — ABNORMAL HIGH (ref 11.0–14.6)
WBC: 2.1 10*3/uL — ABNORMAL LOW (ref 4.0–10.3)
lymph#: 1.3 10*3/uL (ref 0.9–3.3)

## 2012-12-16 ENCOUNTER — Telehealth: Payer: Self-pay | Admitting: Oncology

## 2012-12-16 ENCOUNTER — Other Ambulatory Visit: Payer: Self-pay | Admitting: Oncology

## 2012-12-16 ENCOUNTER — Telehealth: Payer: Self-pay | Admitting: *Deleted

## 2012-12-16 DIAGNOSIS — D61818 Other pancytopenia: Secondary | ICD-10-CM

## 2012-12-16 DIAGNOSIS — D4620 Refractory anemia with excess of blasts, unspecified: Secondary | ICD-10-CM

## 2012-12-16 NOTE — Telephone Encounter (Signed)
Received call today from pt reporting "I want to be seen earlier than September.  I have red marks on both my arms and sometimes they bleed.  Had to go to Urgent Care few days ago because I couldn't get it to stop."  Pt states he also has one red mark on his leg.  Pt denies any other c/o; no fever/pain/N/V and eating/drinking well.

## 2012-12-16 NOTE — Telephone Encounter (Signed)
called pt, left message regarding appt for 7/30 lab and MD

## 2012-12-16 NOTE — Telephone Encounter (Signed)
Instructed pt to come in for lab work at 9:45am tomorrow 12/17/12 and will see Lonna Cobb, NP at 10:15 per Dr. Cyndie Chime.  Pt verbalized understanding and states "I'll be there"

## 2012-12-17 ENCOUNTER — Other Ambulatory Visit (HOSPITAL_BASED_OUTPATIENT_CLINIC_OR_DEPARTMENT_OTHER): Payer: Medicare Other | Admitting: Lab

## 2012-12-17 ENCOUNTER — Ambulatory Visit (HOSPITAL_BASED_OUTPATIENT_CLINIC_OR_DEPARTMENT_OTHER): Payer: Medicare Other | Admitting: Nurse Practitioner

## 2012-12-17 VITALS — BP 128/69 | HR 77 | Temp 97.2°F | Resp 20 | Ht 69.0 in | Wt 155.9 lb

## 2012-12-17 DIAGNOSIS — D4620 Refractory anemia with excess of blasts, unspecified: Secondary | ICD-10-CM

## 2012-12-17 DIAGNOSIS — R233 Spontaneous ecchymoses: Secondary | ICD-10-CM

## 2012-12-17 DIAGNOSIS — D46Z Other myelodysplastic syndromes: Secondary | ICD-10-CM

## 2012-12-17 DIAGNOSIS — D61818 Other pancytopenia: Secondary | ICD-10-CM

## 2012-12-17 DIAGNOSIS — R238 Other skin changes: Secondary | ICD-10-CM

## 2012-12-17 LAB — MORPHOLOGY: PLT EST: ADEQUATE

## 2012-12-17 LAB — CBC WITH DIFFERENTIAL/PLATELET
Basophils Absolute: 0 10*3/uL (ref 0.0–0.1)
EOS%: 0 % (ref 0.0–7.0)
Eosinophils Absolute: 0 10*3/uL (ref 0.0–0.5)
HCT: 33.5 % — ABNORMAL LOW (ref 38.4–49.9)
HGB: 9.8 g/dL — ABNORMAL LOW (ref 13.0–17.1)
MCH: 22.8 pg — ABNORMAL LOW (ref 27.2–33.4)
MCV: 78.1 fL — ABNORMAL LOW (ref 79.3–98.0)
MONO%: 2.1 % (ref 0.0–14.0)
NEUT#: 1.2 10*3/uL — ABNORMAL LOW (ref 1.5–6.5)
NEUT%: 51.5 % (ref 39.0–75.0)
lymph#: 1.1 10*3/uL (ref 0.9–3.3)

## 2012-12-17 NOTE — Progress Notes (Addendum)
OFFICE PROGRESS NOTE  Interval history:  Jason Stafford is an 79 year old man with myelodysplasia. He contacted the office yesterday requesting an appointment for evaluation of bleeding.  On 11/20/2012 he sustained a "cut" on the left forearm. The bleeding would not stop. He went to urgent care and a bandage was placed with resolution of the bleeding. Approximately 2 weeks ago he noted bleeding at the right forearm and subsequently developed a large bruise. He does not recall any trauma. He denies any other bleeding. Specifically no epistaxis, gingival bleeding, hematuria, hematochezia or melena. He overall feels well. He continues to have a good appetite and good energy level. He denies any fevers. He does not take aspirin.   Objective: Blood pressure 128/69, pulse 77, temperature 97.2 F (36.2 C), temperature source Oral, resp. rate 20, height 5\' 9"  (1.753 m), weight 155 lb 14.4 oz (70.716 kg).  Oropharynx is without thrush, ulceration or bleeding. Lungs are clear. Regular cardiac rhythm. Abdomen is soft and nontender. No organomegaly. Extremities are without edema. Calves are nontender. Large ecchymosis at the right forearm showing signs of resolution. Small resolving ecchymosis at the left lower forearm/wrist.   Lab Results: Lab Results  Component Value Date   WBC 2.4* 12/17/2012   HGB 9.8* 12/17/2012   HCT 33.5* 12/17/2012   MCV 78.1* 12/17/2012   PLT 161 12/17/2012    Chemistry:    Chemistry      Component Value Date/Time   NA 142 08/03/2012 1115   NA 142 01/28/2012 1501   K 4.2 08/03/2012 1115   K 4.0 01/28/2012 1501   CL 106 08/03/2012 1115   CL 107 01/28/2012 1501   CO2 27 08/03/2012 1115   CO2 28 01/28/2012 1501   BUN 16 08/03/2012 1115   BUN 18.0 01/28/2012 1501   CREATININE 0.80 08/03/2012 1115   CREATININE 0.8 01/28/2012 1501      Component Value Date/Time   CALCIUM 9.3 08/03/2012 1115   CALCIUM 8.5 01/28/2012 1501   ALKPHOS 72 01/28/2012 1501   AST 22 01/28/2012 1501   ALT 17 01/28/2012 1501    BILITOT 0.50 01/28/2012 1501       Studies/Results: No results found.  Medications: I have reviewed the patient's current medications.  Assessment/Plan:  1. Low-risk myelodysplastic syndrome. Counts remain stable. 2. Bruising over the forearms likely related to age-related skin changes. The bruises appear to be resolving.  Disposition-Jason Stafford's blood counts remain stable and the platelet count is in normal range. The bruising over the forearms is likely related to 79 skin changes seen in the elderly. He is having no other bleeding. He is scheduled to return for a followup visit in 2 months. He will contact the office prior to that visit with further problems. We specifically discussed spontaneous bruising/bleeding.  Patient seen with Dr. Cyndie Chime.  Lonna Cobb ANP/GNP-BC   Hematology attending: I personally interviewed and examined this patient. Pleasant 79 year old man with pancytopenia due to an underlying myelodysplastic syndrome. Overall his counts have been stable. He became concerned when he developed a large spontaneous hematoma subcutaneous right forearm. He also noted some "spots" in his oropharynx. On exam he does have a large 20 cm ecchymosis right forearm with additional ecchymoses on the left arm. He is not on any aspirin or nonsteroidals. His platelet count is stable and at his baseline of 161,000. I reassured him that the ecchymosis although not cosmetically appealing, would not cause any harm to him. He is advised to stay away from aspirin products. He may  have a qualitative platelet disorder associated with his MDS to explain his easy bruisability. He also has 79 thin skin given his age. We'll continue to monitor his counts and clinical status periodically.  Cephas Darby, MD, FACP  Hematology-Oncology

## 2012-12-18 ENCOUNTER — Encounter: Payer: Self-pay | Admitting: Nurse Practitioner

## 2012-12-18 DIAGNOSIS — R233 Spontaneous ecchymoses: Secondary | ICD-10-CM | POA: Insufficient documentation

## 2012-12-18 DIAGNOSIS — R238 Other skin changes: Secondary | ICD-10-CM

## 2012-12-18 HISTORY — DX: Other skin changes: R23.8

## 2012-12-18 HISTORY — DX: Spontaneous ecchymoses: R23.3

## 2013-02-12 ENCOUNTER — Encounter: Payer: Self-pay | Admitting: Vascular Surgery

## 2013-02-12 ENCOUNTER — Other Ambulatory Visit: Payer: Self-pay | Admitting: *Deleted

## 2013-02-12 DIAGNOSIS — I83893 Varicose veins of bilateral lower extremities with other complications: Secondary | ICD-10-CM

## 2013-02-16 ENCOUNTER — Other Ambulatory Visit (HOSPITAL_BASED_OUTPATIENT_CLINIC_OR_DEPARTMENT_OTHER): Payer: Medicare Other | Admitting: Lab

## 2013-02-16 ENCOUNTER — Ambulatory Visit (HOSPITAL_BASED_OUTPATIENT_CLINIC_OR_DEPARTMENT_OTHER): Payer: Medicare Other | Admitting: Oncology

## 2013-02-16 ENCOUNTER — Telehealth: Payer: Self-pay | Admitting: Oncology

## 2013-02-16 VITALS — BP 119/73 | HR 76 | Temp 97.6°F | Resp 18 | Ht 69.0 in | Wt 160.6 lb

## 2013-02-16 DIAGNOSIS — D61818 Other pancytopenia: Secondary | ICD-10-CM

## 2013-02-16 DIAGNOSIS — R238 Other skin changes: Secondary | ICD-10-CM

## 2013-02-16 DIAGNOSIS — D4620 Refractory anemia with excess of blasts, unspecified: Secondary | ICD-10-CM

## 2013-02-16 LAB — MORPHOLOGY
Blasts: 3 % — ABNORMAL HIGH (ref 0–0)
PLT EST: ADEQUATE

## 2013-02-16 LAB — CBC WITH DIFFERENTIAL/PLATELET
Basophils Absolute: 0 10*3/uL (ref 0.0–0.1)
Eosinophils Absolute: 0 10*3/uL (ref 0.0–0.5)
HCT: 29.5 % — ABNORMAL LOW (ref 38.4–49.9)
LYMPH%: 52.9 % — ABNORMAL HIGH (ref 14.0–49.0)
MCV: 75.1 fL — ABNORMAL LOW (ref 79.3–98.0)
MONO#: 0 10*3/uL — ABNORMAL LOW (ref 0.1–0.9)
MONO%: 0.6 % (ref 0.0–14.0)
NEUT#: 1.2 10*3/uL — ABNORMAL LOW (ref 1.5–6.5)
NEUT%: 45.8 % (ref 39.0–75.0)
Platelets: 197 10*3/uL (ref 140–400)
RBC: 3.93 10*6/uL — ABNORMAL LOW (ref 4.20–5.82)
WBC: 2.7 10*3/uL — ABNORMAL LOW (ref 4.0–10.3)

## 2013-02-16 MED ORDER — FA-PYRIDOXINE-CYANOCOBALAMIN 2.5-25-2 MG PO TABS: 1.0000 | ORAL_TABLET | Freq: Every day | ORAL | Status: AC

## 2013-02-16 NOTE — Telephone Encounter (Signed)
gv and printed appt sched and avs for pt for Dec and March 2015  °

## 2013-02-17 NOTE — Progress Notes (Signed)
[name]  Jason Stafford [id number] 14-Jan-1929 79 y.o. 02/17/2013 8:26 PM   Principle Diagnosis: Encounter Diagnoses  Name Primary?  . Pancytopenia   . MDS (myelodysplastic syndrome), low grade Yes  . Easy bruising   . Other pancytopenia      Interim History:   Followup visit for this pleasant 79 year old man referred to this office for evaluation of pancytopenia. He agreed to a bone marrow biopsy which was done in November 2013. Marrow was hypercellular with trilineage dyspoiesis most pronounced in the megakaryocyte cell line. Blast percentage upper limit of normal at 5%. Cytogenetic studies including FISH analysis for chromosome 5 and 7 abnormalities was normal. He was given a diagnosis of low risk myelodysplastic syndrome. I have been following blood counts on an every two-three-month basis. White count differentials have shown up to 8% blasts in the peripheral blood but hemoglobin, total white count, and platelet count have been relatively stable. I did put him on a multivitamin including B12, B6, and folic acid. He remains asymptomatic at this time. He has had no interim infection or bleeding. He is still bothered by frequent ecchymoses.  Medications: reviewed  Allergies:  Allergies  Allergen Reactions  . Protonix [Pantoprazole Sodium] Diarrhea    Review of Systems: Constitutional:   Appetite is good, weight stable, no interim fever or infection HEENT no sore throat Respiratory: No cough or dyspnea Cardiovascular:  No chest pain or palpitations Gastrointestinal: No abdominal pain or change in bowel habit Genito-Urinary: No urinary tract symptoms Musculoskeletal: No muscle bone or joint pain Neurologic: No headache or change in vision Skin: No rash.  Remaining ROS negative.    Physical Exam: Blood pressure 119/73, pulse 76, temperature 97.6 F (36.4 C), temperature source Oral, resp. rate 18, height 5\' 9"  (1.753 m), weight 160 lb 9.6  oz (72.848 kg), SpO2 100.00%. Wt Readings from Last 3 Encounters:  02/16/13 160 lb 9.6 oz (72.848 kg)  12/17/12 155 lb 14.4 oz (70.716 kg)  09/01/12 158 lb 1.6 oz (71.714 kg)     General appearance: Thin Caucasian [name]: Pharynx no erythema exudate or ulcer, no thyromegaly Lymph nodes: No cervical, supraclavicular, or axillary adenopathy Breasts: Lungs: Clear to auscultation resonant to percussion Heart: Regular rhythm no murmur Abdomen: Soft, nontender, no mass, no organomegaly Extremities: No edema, no calf tenderness Musculoskeletal: No joint deformities GU: Vascular: No carotid bruits, no cyanosis Neurologic: Mental status intact, PERRLA, motor strength 5 over 5, reflexes 1+ symmetric Skin: No rash. Scattered small ecchymoses on his arms  Lab Results: Lab Results  Component Value Date   WBC 2.7* 02/16/2013   HGB 9.0* 02/16/2013   HCT 29.5* 02/16/2013   MCV 75.1* 02/16/2013   PLT 197 02/16/2013  White count differential: 46% neutrophils, 53% lymphocytes, 3% blasts   Chemistry      Component Value Date/Time   NA 142 08/03/2012 1115   NA 142 01/28/2012 1501   K 4.2 08/03/2012 1115   K 4.0 01/28/2012 1501   CL 106 08/03/2012 1115   CL 107 01/28/2012 1501   CO2 27 08/03/2012 1115   CO2 28 01/28/2012 1501   BUN 16 08/03/2012 1115   BUN 18.0 01/28/2012 1501   CREATININE 0.80 08/03/2012 1115   CREATININE 0.8 01/28/2012 1501      Component Value Date/Time   CALCIUM 9.3 08/03/2012 1115   CALCIUM 8.5 01/28/2012 1501   ALKPHOS 72 01/28/2012 1501   AST 22 01/28/2012 1501   ALT 17 01/28/2012 1501  BILITOT 0.50 01/28/2012 1501        Impression: Low-intermediate risk myelodysplastic syndrome Counts relatively stable now over one year of observation. We discussed risk of transformation to acute leukemia. This is unpredictable but it is a good sign that despite the persistent presence of blasts in his peripheral blood that overall his counts have been stable. Only trend is for a slow fall in his  hemoglobin. Platelet count, if anything, has improved over time. Plan: Continue to monitor his blood counts periodically. If he does convert to acute leukemia he would not be a candidate for low dose induction chemotherapy. In the absence of a suitable clinical trial, and if his performance status remains good, I would consider using Vidaza or decitabine   CC:.    Levert Feinstein, MD 9/30/20148:26 PM

## 2013-02-18 ENCOUNTER — Telehealth: Payer: Self-pay | Admitting: Oncology

## 2013-02-18 NOTE — Telephone Encounter (Signed)
Pt called and upset that he has to see Misty Stanley instead of MD, he goes on on about co pay and see an ML, emailed Dr. Reece Agar, waiting for answer

## 2013-03-18 ENCOUNTER — Encounter: Payer: Self-pay | Admitting: Vascular Surgery

## 2013-03-19 ENCOUNTER — Ambulatory Visit (INDEPENDENT_AMBULATORY_CARE_PROVIDER_SITE_OTHER): Payer: Medicare Other | Admitting: Vascular Surgery

## 2013-03-19 ENCOUNTER — Encounter: Payer: Self-pay | Admitting: Vascular Surgery

## 2013-03-19 ENCOUNTER — Ambulatory Visit (HOSPITAL_COMMUNITY)
Admission: RE | Admit: 2013-03-19 | Discharge: 2013-03-19 | Disposition: A | Discharge status: Home / Self Care | Payer: Medicare Other | Source: Ambulatory Visit | Attending: Vascular Surgery | Admitting: Vascular Surgery

## 2013-03-19 VITALS — BP 150/71 | HR 146 | Ht 69.0 in | Wt 157.0 lb

## 2013-03-19 DIAGNOSIS — I83893 Varicose veins of bilateral lower extremities with other complications: Secondary | ICD-10-CM | POA: Insufficient documentation

## 2013-03-19 NOTE — Progress Notes (Signed)
[facility] [place] HISTORY AND PHYSICAL   History of Present Illness:  Patient is a 79 y.o. year old male who presents for evaluation of recent thrombophlebitis right leg.  Patient had an episode in June of 2014 where he heard his right foot. He was told that this may have been gout.  He was also told that it could be thrombophlebitis. He denies prior history of DVT. He has had no prior history of skin ulcerations or bleeding. He does wear compression stockings has relief of some of the achiness in his legs with these.. Other medical problems include prostate cancer, myelodysplastic syndrome, hyperlipidemia all of which are currently stable.  Past Medical History  Diagnosis Date  . Pancytopenia 01/17/2012    Hb 12.5, MCV 94, WBC 4,200 54 P, 32 L , 12 Mono, platelets 75,000  01/15/12  . Prostate ca 01/17/2012    Gleason 7, Rx prostatectomy Aug, 1997  . Hyperlipidemia 01/17/2012  . MDS (myelodysplastic syndrome), low grade 04/18/2012    Bone marrow biopsy 04/03/12: 50%-60% cellularity. Mild dyserythropoiesis. Left shift. Significant dysmegakaryopoiesis. 5% blasts. Normal karyotype. Probes for chromosomes 5 and 7 by [facility] analysis showed no deletions. IPSS revised criteria: low risk  5.3 years median survivial ( 2 points for 5% blasts, 0 points for each of following:  Hemoglobin >10; ANC>1000, platelets>100,000; normal chromosomes)  . Easy bruising 12/18/2012    Due to MDS.  Qualitative platelet abnormality?  . Varicose veins     Past Surgical History  Procedure Laterality Date  . Prostate surgery  aug 1997    cancer  . Hernia repair  aug 2011    Social History History  Substance Use Topics  . Smoking status: Former Smoker    Quit date: 01/12/1979  . Smokeless tobacco: Not on file  . Alcohol Use: 1.2 oz/week    2 Glasses of wine per week     Comment: weekly    Family History Family History  Problem Relation Age of Onset  . Heart disease Mother   . Heart attack  Mother   . Heart disease Father     Allergies  Allergies  Allergen Reactions  . Protonix [Pantoprazole Sodium] Diarrhea     Current Outpatient Prescriptions  Medication Sig Dispense Refill  . Calcium Carb-Cholecalciferol (OS-CAL) 500-600 MG-UNIT CHEW Chew 1 tablet by mouth daily.      Marland Kitchen COENZYME Q10-RED YEAST RICE PO Take 1 tablet by mouth daily. Red yeast 600mg  & 100mg  CQ10      . folic acid-pyridoxine-cyancobalamin (FOLTX) 2.5-25-2 MG TABS Take 1 tablet by mouth daily.  90 each  11  . Multiple Vitamins-Minerals (MULTIVITAMIN WITH MINERALS) tablet Take 1 tablet by mouth daily.         No current facility-administered medications for this visit.    ROS:   General:  No weight loss, Fever, chills  HEENT: No recent headaches, no nasal bleeding, no visual changes, no sore throat  Neurologic: No dizziness, blackouts, seizures. No recent symptoms of stroke or mini- stroke. No recent episodes of slurred speech, or temporary blindness.  Cardiac: No recent episodes of chest pain/pressure, no shortness of breath at rest.  No shortness of breath with exertion.  Denies history of atrial fibrillation or irregular heartbeat  Vascular: No history of rest pain in feet.  No history of claudication.  No history of non-healing ulcer, No history of DVT   Pulmonary: No home oxygen, no productive cough, no hemoptysis,  No asthma or wheezing  Musculoskeletal:  [ ]  Arthritis, [ ]  Low back pain,  [ ]  Joint pain  Hematologic:No history of hypercoagulable state.  No history of easy bleeding.  No history of anemia  Gastrointestinal: No hematochezia or melena,  No gastroesophageal reflux, no trouble swallowing  Urinary: [ ]  chronic Kidney disease, [ ]  on HD - [ ]  MWF or [ ]  TTHS, [ ]  Burning with urination, [ ]  Frequent urination, [ ]  Difficulty urinating;   Skin: No rashes  Psychological: No history of anxiety,  No history of depression   Physical Examination  Filed Vitals:   03/19/13 1514   BP: 150/71  Pulse: 146  Height: 5\' 9"  (1.753 m)  Weight: 157 lb (71.215 kg)  SpO2: 100%    Body mass index is 23.17 kg/(m^2).  General:  Alert and oriented, no acute distress HEENT: Normal Neck: No bruit or JVD Pulmonary: Clear to auscultation bilaterally Cardiac: Regular Rate and Rhythm without murmur Abdomen: Soft, non-tender, non-distended, no mass, well-healed lower midline scar Skin: No rash, scattered small varicosities right and left calf area all less than 2 mm in diameter Extremity Pulses:  2+ radial, brachial, femoral, dorsalis pedis, posterior tibial pulses bilaterally Musculoskeletal: No deformity trace edema right ankle dorsal foot, 1.5 x 1.5 cm nodule left anterior thigh which is not fixed it is firm, I am able to pick this up off the muscle and it is completely subcutaneous  Neurologic: Upper and lower extremity motor 5/5 and symmetric  DATA:  Patient had a venous reflux exam today. This showed no evidence of deep vein thrombosis. He did have some deep vein reflux in the right side. He also had some deep vein reflux in the left side. There is no superficial reflux bilaterally.   ASSESSMENT:  Bilateral deep vein reflux with some scattered varicosities essentially asymptomatic at this point.  Nodule left anterior thigh   PLAN:  Mainstay of therapy for deep vein reflux is compression therapy. He does not have superficial venous reflux and is not a candidate for laser ablation of his greater saphenous vein. If he develops symptoms of recurrent thrombophlebitis usual measures of nonsteroidals warm compresses should help with symptoms. He will followup with me on as-needed basis. He will continue to watch the nodule in his left thigh. If this is changes in diameter size or any other features he will inform his primary care physician.  Fabienne Bruns, MD [facility] [place] Office: [phone] Pager: [phone]

## 2013-03-23 ENCOUNTER — Telehealth: Payer: Self-pay | Admitting: Oncology

## 2013-03-23 NOTE — Telephone Encounter (Signed)
Pt is aware of appt on 08/17/13 lab and MD

## 2013-05-09 ENCOUNTER — Emergency Department (HOSPITAL_BASED_OUTPATIENT_CLINIC_OR_DEPARTMENT_OTHER)
Admission: EM | Admit: 2013-05-09 | Discharge: 2013-05-09 | Disposition: A | Discharge status: Home / Self Care | Payer: Medicare Other | Attending: Emergency Medicine | Admitting: Emergency Medicine

## 2013-05-09 ENCOUNTER — Encounter (HOSPITAL_BASED_OUTPATIENT_CLINIC_OR_DEPARTMENT_OTHER): Payer: Self-pay | Admitting: Emergency Medicine

## 2013-05-09 DIAGNOSIS — Z862 Personal history of diseases of the blood and blood-forming organs and certain disorders involving the immune mechanism: Secondary | ICD-10-CM | POA: Insufficient documentation

## 2013-05-09 DIAGNOSIS — Z8546 Personal history of malignant neoplasm of prostate: Secondary | ICD-10-CM | POA: Insufficient documentation

## 2013-05-09 DIAGNOSIS — Z8639 Personal history of other endocrine, nutritional and metabolic disease: Secondary | ICD-10-CM | POA: Insufficient documentation

## 2013-05-09 DIAGNOSIS — R040 Epistaxis: Secondary | ICD-10-CM

## 2013-05-09 DIAGNOSIS — Z79899 Other long term (current) drug therapy: Secondary | ICD-10-CM | POA: Insufficient documentation

## 2013-05-09 DIAGNOSIS — Z87891 Personal history of nicotine dependence: Secondary | ICD-10-CM | POA: Insufficient documentation

## 2013-05-09 MED ORDER — OXYMETAZOLINE HCL 0.05 % NA SOLN
NASAL | Status: AC
  Administered 2013-05-09: 15:00:00
  Filled 2013-05-09: qty 15

## 2013-05-09 NOTE — ED Provider Notes (Signed)
CSN: [id number]     Arrival date & time 05/09/13  1405 History   First MD Initiated Contact with Patient 05/09/13 1453     Chief Complaint  Patient presents with  . Epistaxis   (Consider location/radiation/quality/duration/timing/severity/associated sxs/prior Treatment) HPI Comments: Had anterior pack and cauterization yesterday at urgent care. Continuing to ooze today. No clots since cauterization.   Patient is a 79 y.o. male presenting with nosebleeds. The history is provided by the patient.  Epistaxis Location:  R nare Severity:  Mild Timing:  Constant Progression:  Worsening Chronicity:  New Context: weather change   Context: not anticoagulants, not aspirin use, not BiPAP, not bleeding disorder, not CPAP, not home oxygen and not hypertension   Relieved by:  Nothing Worsened by:  Nothing tried Associated symptoms: no blood in oropharynx, no congestion, no cough, no fever, no sore throat and no syncope     Past Medical History  Diagnosis Date  . Pancytopenia 01/17/2012    Hb 12.5, MCV 94, WBC 4,200 54 P, 32 L , 12 Mono, platelets 75,000  01/15/12  . Prostate ca 01/17/2012    Gleason 7, Rx prostatectomy Aug, 1997  . Hyperlipidemia 01/17/2012  . MDS (myelodysplastic syndrome), low grade 04/18/2012    Bone marrow biopsy 04/03/12: 50%-60% cellularity. Mild dyserythropoiesis. Left shift. Significant dysmegakaryopoiesis. 5% blasts. Normal karyotype. Probes for chromosomes 5 and 7 by [facility] analysis showed no deletions. IPSS revised criteria: low risk  5.3 years median survivial ( 2 points for 5% blasts, 0 points for each of following:  Hemoglobin >10; ANC>1000, platelets>100,000; normal chromosomes)  . Easy bruising 12/18/2012    Due to MDS.  Qualitative platelet abnormality?  . Varicose veins    Past Surgical History  Procedure Laterality Date  . Prostate surgery  aug 1997    cancer  . Hernia repair  aug 2011   Family History  Problem Relation Age of Onset  . Heart disease Mother    . Heart attack Mother   . Heart disease Father    History  Substance Use Topics  . Smoking status: Former Smoker    Quit date: 01/12/1979  . Smokeless tobacco: Not on file  . Alcohol Use: 1.2 oz/week    2 Glasses of wine per week     Comment: weekly    Review of Systems  Constitutional: Negative for fever and chills.  HENT: Positive for nosebleeds. Negative for congestion and sore throat.   Respiratory: Negative for cough.   Cardiovascular: Negative for syncope.  All other systems reviewed and are negative.    Allergies  Protonix  Home Medications   Current Outpatient Rx  Name  Route  Sig  Dispense  Refill  . Calcium Carb-Cholecalciferol (OS-CAL) 500-600 MG-UNIT CHEW   Oral   Chew 1 tablet by mouth daily.         Marland Kitchen COENZYME Q10-RED YEAST RICE PO   Oral   Take 1 tablet by mouth daily. Red yeast 600mg  & 100mg  CQ10         . folic acid-pyridoxine-cyancobalamin (FOLTX) 2.5-25-2 MG TABS   Oral   Take 1 tablet by mouth daily.   90 each   11     Script given to pt 04/18/12 for foltx vitamin B co ...   . Multiple Vitamins-Minerals (MULTIVITAMIN WITH MINERALS) tablet   Oral   Take 1 tablet by mouth daily.            BP 144/66  Pulse 85  Temp(Src) 98.1 F (  36.7 C) (Oral)  Resp 18  Ht 5\' 9"  (1.753 m)  Wt 160 lb (72.576 kg)  BMI 23.62 kg/m2  SpO2 100% Physical Exam  Constitutional: He is oriented to person, place, and time. He appears well-developed and well-nourished. No distress.  HENT:  Head: Normocephalic and atraumatic.  Mouth/Throat: No oropharyngeal exudate.  R nare with extensive white plaque s/p cauterization. Small areas on septum, turbinate, nare with mild oozing. No posterior bleeding.   Eyes: EOM are normal. Pupils are equal, round, and reactive to light.  Neck: Normal range of motion. Neck supple.  Cardiovascular: Normal rate and regular rhythm.  Exam reveals no friction rub.   No murmur heard. Pulmonary/Chest: Effort normal and breath  sounds normal. No respiratory distress. He has no wheezes. He has no rales.  Abdominal: He exhibits no distension. There is no tenderness. There is no rebound.  Musculoskeletal: Normal range of motion. He exhibits no edema.  Neurological: He is alert and oriented to person, place, and time.  Skin: He is not diaphoretic.    ED Course  EPISTAXIS MANAGEMENT Date/Time: 05/09/2013 3:51 PM Performed by: Dagmar Hait Authorized by: Dagmar Hait Consent: Verbal consent obtained. Patient sedated: no Treatment site: right anterior Repair method: silver nitrate Post-procedure assessment: bleeding stopped Treatment complexity: simple Patient tolerance: Patient tolerated the procedure well with no immediate complications.   (including critical care time) Labs Review Labs Reviewed - No data to display Imaging Review No results found.  EKG Interpretation   None       MDM   1. Anterior epistaxis    40M with anterior nose bleed. Recently cauterized yesterday. Continuing to ooze today despite packing in place. Was previously clotting, no clots now. Not on anticoagulants, CPAP/BiPAP.  AFVSS here. Mild anterior bleeding and extensive plaque s/p cauterization. Further cauterization performed, see above.  After cauterization, patient states occasional bleeding, but none on my exam. Instructed to followup with ENT in 2-3 days.  Dagmar Hait, MD 05/09/13 [phone]

## 2013-05-09 NOTE — ED Notes (Signed)
Patient here with ongoing nosebleed since Thursday night. [facility] at [facility] yesterday and cauterized with right nare packing. Today has continued to ooze and fresh blood noted on packing

## 2013-05-18 ENCOUNTER — Other Ambulatory Visit (HOSPITAL_BASED_OUTPATIENT_CLINIC_OR_DEPARTMENT_OTHER): Payer: Medicare Other

## 2013-05-18 DIAGNOSIS — R238 Other skin changes: Secondary | ICD-10-CM

## 2013-05-18 DIAGNOSIS — D61818 Other pancytopenia: Secondary | ICD-10-CM

## 2013-05-18 DIAGNOSIS — D4620 Refractory anemia with excess of blasts, unspecified: Secondary | ICD-10-CM

## 2013-05-18 LAB — CBC WITH DIFFERENTIAL/PLATELET
BASO%: 0.5 % (ref 0.0–2.0)
EOS%: 0 % (ref 0.0–7.0)
HCT: 31.3 % — ABNORMAL LOW (ref 38.4–49.9)
MCH: 22.4 pg — ABNORMAL LOW (ref 27.2–33.4)
MCHC: 29.1 g/dL — ABNORMAL LOW (ref 32.0–36.0)
MONO#: 0 10*3/uL — ABNORMAL LOW (ref 0.1–0.9)
NEUT%: 48.5 % (ref 39.0–75.0)
RBC: 4.06 10*6/uL — ABNORMAL LOW (ref 4.20–5.82)
WBC: 2.2 10*3/uL — ABNORMAL LOW (ref 4.0–10.3)
lymph#: 1.1 10*3/uL (ref 0.9–3.3)
nRBC: 0 % (ref 0–0)

## 2013-05-18 LAB — MORPHOLOGY: PLT EST: ADEQUATE

## 2013-05-19 ENCOUNTER — Telehealth: Payer: Self-pay | Admitting: *Deleted

## 2013-05-19 NOTE — Telephone Encounter (Signed)
Pt notified of stable counts per Dr Cyndie Chime.

## 2013-05-19 NOTE — Telephone Encounter (Signed)
Message copied by Sabino Snipes on Tue May 19, 2013 12:45 PM ------      Message from: Levert Feinstein      Created: Tue May 19, 2013  8:29 AM       Call pt: counts stable compared with previous ------

## 2013-06-08 ENCOUNTER — Telehealth: Payer: Self-pay | Admitting: *Deleted

## 2013-06-08 NOTE — Telephone Encounter (Addendum)
Received vm call from pt stating that he has a growth underneath one of the marks he gets from his folic acid.  Returned call & he states that he gets marks all over his body like bruises from the folic acid but has a growth under one on his stomach above belly button toward the left side.  He reports that it is not sore & is about the size of a marble.  He has not seen his PCP.  Note to Dr. Beryle Beams. Notified pt per Dr.Granfortuna that folic acid does not cause marks on the skin & pt should see PCP first.  Pt expressed understanding.

## 2013-07-18 ENCOUNTER — Encounter: Payer: Self-pay | Admitting: Oncology

## 2013-08-17 ENCOUNTER — Other Ambulatory Visit: Payer: Medicare Other

## 2013-08-17 ENCOUNTER — Ambulatory Visit: Payer: Medicare Other | Admitting: Oncology

## 2013-08-17 ENCOUNTER — Ambulatory Visit: Payer: Medicare Other | Admitting: Nurse Practitioner

## 2013-08-25 ENCOUNTER — Other Ambulatory Visit: Payer: Self-pay | Admitting: Oncology

## 2013-08-25 ENCOUNTER — Encounter: Payer: Self-pay | Admitting: Oncology

## 2013-08-25 ENCOUNTER — Ambulatory Visit (INDEPENDENT_AMBULATORY_CARE_PROVIDER_SITE_OTHER): Payer: Medicare Other | Admitting: Oncology

## 2013-08-25 VITALS — BP 111/62 | HR 67 | Temp 97.8°F | Resp 20 | Ht 67.0 in | Wt 160.1 lb

## 2013-08-25 DIAGNOSIS — D4620 Refractory anemia with excess of blasts, unspecified: Secondary | ICD-10-CM

## 2013-08-25 DIAGNOSIS — R238 Other skin changes: Secondary | ICD-10-CM

## 2013-08-25 DIAGNOSIS — R233 Spontaneous ecchymoses: Secondary | ICD-10-CM

## 2013-08-25 LAB — COMPLETE METABOLIC PANEL WITH GFR
ALT: 11 U/L (ref 0–53)
AST: 17 U/L (ref 0–37)
Albumin: 3.7 g/dL (ref 3.5–5.2)
Alkaline Phosphatase: 72 U/L (ref 39–117)
BILIRUBIN TOTAL: 0.4 mg/dL (ref 0.2–1.2)
BUN: 20 mg/dL (ref 6–23)
CALCIUM: 8.9 mg/dL (ref 8.4–10.5)
CO2: 32 meq/L (ref 19–32)
CREATININE: 0.84 mg/dL (ref 0.50–1.35)
Chloride: 106 mEq/L (ref 96–112)
GFR, EST NON AFRICAN AMERICAN: 82 mL/min
GLUCOSE: 93 mg/dL (ref 70–99)
Potassium: 5.3 mEq/L (ref 3.5–5.3)
Sodium: 140 mEq/L (ref 135–145)
Total Protein: 6 g/dL (ref 6.0–8.3)

## 2013-08-25 LAB — SAVE SMEAR

## 2013-08-25 LAB — LACTATE DEHYDROGENASE: LDH: 198 U/L (ref 94–250)

## 2013-08-25 NOTE — Progress Notes (Signed)
Patient ID: Jason Stafford, male   DOB: 06/20/1928, 80 y.o.   MRN: [id number] [name]  Jason Stafford [id number] [birth year] 80 y.o. 08/25/2013 3:05 PM   Principle Diagnosis: Encounter Diagnoses  Name Primary?  . MDS (myelodysplastic syndrome), low grade Yes  . Easy bruising      Interim History:   Followup visit for this pleasant 80 year old man initially  referred for evaluation of pancytopenia. Bone marrow biopsy  done in November 2013. Marrow was hypercellular with trilineage dyspoiesis most pronounced in the megakaryocyte cell line which is curious since his platelet count was and remains normal  Blast percentage upper limit of normal at 5%. Cytogenetic studies including FISH analysis for chromosome 5 and 7 abnormalities was normal. He was given a diagnosis of low risk myelodysplastic syndrome. I have been following blood counts on an every two-three-month basis. White count differentials have shown up to 8% blasts in the peripheral blood but hemoglobin, total white count, and platelet count have been relatively stable. I did put him on a multivitamins including J19, B6, and folic acid but there was no significant improvement in his counts.  He remains asymptomatic at this time. He has had no interim infection or bleeding. He got through the winter without any influenza. He did take a flu shot. He is still bothered by frequent ecchymoses. Although part of the problem may relate to qualitative platelet dysfunction I think it relates more to the thinning of his skin with age. We reviewed this again today. He was recently evaluated by Dr. Juanda Crumble fields to exclude possibility of thrombosis of his lower extremities. He presented with an episode of thrombophlebitis of the right leg dating back to June 2014. Vascular evaluation was done 03/19/2013. No evidence for any active clot at that time.    Medications: reviewed  Allergies:  Allergies  Allergen  Reactions  . Protonix [Pantoprazole Sodium] Diarrhea    Review of Systems: Hematology:  See above ENT ROS: No sore throat Breast ROS:  Respiratory ROS: No cough or dyspnea Cardiovascular ROS: No chest pain or palpitations  Gastrointestinal ROS:  No abdominal pain or change in bowel habit  Genito-Urinary ROS: PSA monitored by his urologist Musculoskeletal ROS: No new muscle bone or joint pain Neurological ROS: No headache or change in vision Dermatological ROS: No rash Remaining ROS negative:   Physical Exam: Blood pressure 111/62, pulse 67, temperature 97.8 F (36.6 C), temperature source Oral, resp. rate 20, height 5' 7"  (1.702 m), weight 160 lb 1.6 oz (72.621 kg), SpO2 97.00%. Wt Readings from Last 3 Encounters:  08/25/13 160 lb 1.6 oz (72.621 kg)  05/09/13 160 lb (72.576 kg)  03/19/13 157 lb (71.215 kg)     General appearance: Thin Caucasian [name]: Pharynx no erythema, exudate, mass, or ulcer. No thyromegaly or thyroid nodules Lymph nodes: No cervical, supraclavicular, or axillary lymphadenopathy Breasts: Lungs: Clear to auscultation, resonant to percussion throughout Heart: Regular rhythm, no murmur, no gallop, no rub, no click, no edema Abdomen: Soft, nontender, normal bowel sounds, no mass, no organomegaly Extremities: No edema, no calf tenderness Musculoskeletal: no joint deformities GU:  Vascular: Carotid pulses 2+, no bruits, Neurologic: Alert, oriented, PERRLA, optic discs sharp and vessels normal, no hemorrhage or exudate, cranial nerves grossly normal, motor strength 5 over 5, reflexes 1+ symmetric, upper body coordination normal, gait normal, Skin: No rash; scattered ecchymosis on his forearms left greater than right  Lab Results: Pending for 08/25/2013 CBC W/Diff  Component Value Date/Time   WBC 2.2* 05/18/2013 1409   WBC 2.0* 08/03/2012 1115   RBC 4.06* 05/18/2013 1409   RBC 4.11* 08/03/2012 1115   HGB 9.1* 05/18/2013 1409   HGB 10.3* 08/03/2012 1115    HCT 31.3* 05/18/2013 1409   HCT 33.4* 08/03/2012 1115   PLT 214 05/18/2013 1409   PLT 153 08/03/2012 1115   MCV 77.1* 05/18/2013 1409   MCV 81.3 08/03/2012 1115   MCH 22.4* 05/18/2013 1409   MCH 25.1* 08/03/2012 1115   MCHC 29.1* 05/18/2013 1409   MCHC 30.8 08/03/2012 1115   RDW 18.1* 05/18/2013 1409   RDW 17.8* 08/03/2012 1115   LYMPHSABS 1.1 05/18/2013 1409   LYMPHSABS 0.6* 08/03/2012 1115   MONOABS 0.0* 05/18/2013 1409   MONOABS 0.2 08/03/2012 1115   EOSABS 0.0 05/18/2013 1409   EOSABS 0.0 08/03/2012 1115   BASOSABS 0.0 05/18/2013 1409   BASOSABS 0.1 08/03/2012 1115     Chemistry      Component Value Date/Time   NA 142 08/03/2012 1115   NA 142 01/28/2012 1501   K 4.2 08/03/2012 1115   K 4.0 01/28/2012 1501   CL 106 08/03/2012 1115   CL 107 01/28/2012 1501   CO2 27 08/03/2012 1115   CO2 28 01/28/2012 1501   BUN 16 08/03/2012 1115   BUN 18.0 01/28/2012 1501   CREATININE 0.80 08/03/2012 1115   CREATININE 0.8 01/28/2012 1501      Component Value Date/Time   CALCIUM 9.3 08/03/2012 1115   CALCIUM 8.5 01/28/2012 1501   ALKPHOS 72 01/28/2012 1501   AST 22 01/28/2012 1501   ALT 17 01/28/2012 1501   BILITOT 0.50 01/28/2012 1501       Radiological Studies: No results found.  Impression:  #1. Low-intermediate risk MDS  today's lab pending but up through 05/18/2013 pounds overall stable with hemoglobin 9, white count 2200 with 49% neutrophils, and platelet count 214,000. I will continue to monitor blood counts every 3 months with M.D. visits every 6 months but he is encouraged to call for any interim problems.   CC: Patient Care Team: Jani Gravel, MD as PCP - General (Internal Medicine)   Annia Belt, MD 4/7/20153:05 PM

## 2013-08-25 NOTE — Patient Instructions (Addendum)
Lab here today Continue lab checks every 3 months next 7/7: lab draw at [facility] for your convenience Follow up visit with Dr Beryle Beams  6 months October 13 at 9 AM

## 2013-08-26 ENCOUNTER — Telehealth: Payer: Self-pay | Admitting: Oncology

## 2013-08-26 ENCOUNTER — Telehealth: Payer: Self-pay | Admitting: *Deleted

## 2013-08-26 LAB — CBC WITH DIFFERENTIAL/PLATELET
BASOS PCT: 1 % (ref 0–1)
Basophils Absolute: 0 10*3/uL (ref 0.0–0.1)
EOS ABS: 0 10*3/uL (ref 0.0–0.7)
EOS PCT: 0 % (ref 0–5)
HCT: 31.6 % — ABNORMAL LOW (ref 39.0–52.0)
HEMOGLOBIN: 9.6 g/dL — AB (ref 13.0–17.0)
Lymphocytes Relative: 44 % (ref 12–46)
Lymphs Abs: 1 10*3/uL (ref 0.7–4.0)
MCH: 22.4 pg — AB (ref 26.0–34.0)
MCHC: 30.4 g/dL (ref 30.0–36.0)
MCV: 73.7 fL — AB (ref 78.0–100.0)
MONO ABS: 0 10*3/uL — AB (ref 0.1–1.0)
MONOS PCT: 1 % — AB (ref 3–12)
NEUTROS PCT: 54 % (ref 43–77)
Neutro Abs: 1.2 10*3/uL — ABNORMAL LOW (ref 1.7–7.7)
Platelets: 115 10*3/uL — ABNORMAL LOW (ref 150–400)
RBC: 4.29 MIL/uL (ref 4.22–5.81)
RDW: 18.8 % — ABNORMAL HIGH (ref 11.5–15.5)
WBC: 2.2 10*3/uL — ABNORMAL LOW (ref 4.0–10.5)

## 2013-08-26 NOTE — Telephone Encounter (Signed)
Pt's wife contacted and informed that labs are not now due until 11/24/13 per Dr. Beryle Beams and that pt can continue to have labs drawn at [facility] since that is the pt's preference as they perceive [facility] to be closer for them to drive from their home in [place]. Pt's wife verbalizes understanding of these instructions and will call [facility] back to schedule labs to be done around 11/24/13. Leroy Sea, 08/26/13, 9:41 AM

## 2013-08-26 NOTE — Telephone Encounter (Signed)
lvm for pt regarding to April appt....mailed pt appt sched/avs and letter °

## 2013-08-26 NOTE — Telephone Encounter (Signed)
He was the guy who only told me he wanted lab done at [facility] after I completed initial orders - he does not need repeat labs until 7/7 [facility]

## 2013-08-26 NOTE — Telephone Encounter (Signed)
Pt's wife answered call and asked for lab results to be given to her. She was told that pt blood counts overall stable compared with previous labs. Platelet count lower but still in safe range. Pt's wife verbalizes understanding of this info and asked why Jason Stafford had just called her for next labs to be drawn April  30. She was instructed that I would contact Dr. Beryle Beams to verify date of next lab draw and call her back by end of business today. Leroy Sea, 08/26/13, 8:47 A

## 2013-09-09 ENCOUNTER — Encounter (HOSPITAL_BASED_OUTPATIENT_CLINIC_OR_DEPARTMENT_OTHER): Payer: Self-pay | Admitting: Emergency Medicine

## 2013-09-09 ENCOUNTER — Emergency Department (HOSPITAL_BASED_OUTPATIENT_CLINIC_OR_DEPARTMENT_OTHER): Payer: Medicare Other

## 2013-09-09 ENCOUNTER — Emergency Department (HOSPITAL_BASED_OUTPATIENT_CLINIC_OR_DEPARTMENT_OTHER)
Admission: EM | Admit: 2013-09-09 | Discharge: 2013-09-09 | Disposition: A | Discharge status: Home / Self Care | Payer: Medicare Other | Attending: Emergency Medicine | Admitting: Emergency Medicine

## 2013-09-09 DIAGNOSIS — T148XXA Other injury of unspecified body region, initial encounter: Secondary | ICD-10-CM

## 2013-09-09 DIAGNOSIS — Z8546 Personal history of malignant neoplasm of prostate: Secondary | ICD-10-CM | POA: Insufficient documentation

## 2013-09-09 DIAGNOSIS — Z79899 Other long term (current) drug therapy: Secondary | ICD-10-CM | POA: Insufficient documentation

## 2013-09-09 DIAGNOSIS — S20229A Contusion of unspecified back wall of thorax, initial encounter: Secondary | ICD-10-CM | POA: Insufficient documentation

## 2013-09-09 DIAGNOSIS — Z87891 Personal history of nicotine dependence: Secondary | ICD-10-CM | POA: Insufficient documentation

## 2013-09-09 DIAGNOSIS — W1809XA Striking against other object with subsequent fall, initial encounter: Secondary | ICD-10-CM | POA: Insufficient documentation

## 2013-09-09 DIAGNOSIS — Y929 Unspecified place or not applicable: Secondary | ICD-10-CM | POA: Insufficient documentation

## 2013-09-09 DIAGNOSIS — Y9389 Activity, other specified: Secondary | ICD-10-CM | POA: Insufficient documentation

## 2013-09-09 DIAGNOSIS — Z8679 Personal history of other diseases of the circulatory system: Secondary | ICD-10-CM | POA: Insufficient documentation

## 2013-09-09 MED ORDER — HYDROCODONE-ACETAMINOPHEN 5-325 MG PO TABS
1.0000 | ORAL_TABLET | ORAL | Status: AC | PRN
Start: 2013-09-09 — End: ?

## 2013-09-09 NOTE — Discharge Instructions (Signed)

## 2013-09-09 NOTE — ED Notes (Signed)
MD at bedside to discuss results of testing. 

## 2013-09-09 NOTE — ED Notes (Signed)
Patient transported to X-ray ambulatory with tech. 

## 2013-09-09 NOTE — ED Notes (Signed)
Pt was washing and lost grip and fell to floor.  No head injury.  Pt hit right side buttocks.  Pt states pain continues despite medication.

## 2013-09-09 NOTE — ED Provider Notes (Signed)
CSN: [id number]     Arrival date & time 09/09/13  6767 History   First MD Initiated Contact with Patient 09/09/13 [phone]     Chief Complaint  Patient presents with  . Fall     (Consider location/radiation/quality/duration/timing/severity/associated sxs/prior Treatment) HPI Comments: Patient presents to the ER for evaluation of right buttock area pain after a fall. She fell around midnight last night. He was in the bathroom, lost his balance and fell backwards. He was able to partially hold on to the back he commented on his head. Landed in a seated position. Patient has been having moderate to severe, 8/10 pain in the right back and pelvic area since. Pain worsens with movement and walking. No neck or midline back pain.  Patient is a 80 y.o. male presenting with fall.  Fall Pertinent negatives include no headaches.    Past Medical History  Diagnosis Date  . Pancytopenia 01/17/2012    Hb 12.5, MCV 94, WBC 4,200 54 P, 32 L , 12 Mono, platelets 75,000  01/15/12  . Prostate ca 01/17/2012    Gleason 7, Rx prostatectomy Aug, 1997  . Hyperlipidemia 01/17/2012  . MDS (myelodysplastic syndrome), low grade 04/18/2012    Bone marrow biopsy 04/03/12: 50%-60% cellularity. Mild dyserythropoiesis. Left shift. Significant dysmegakaryopoiesis. 5% blasts. Normal karyotype. Probes for chromosomes 5 and 7 by [facility] analysis showed no deletions. IPSS revised criteria: low risk  5.3 years median survivial ( 2 points for 5% blasts, 0 points for each of following:  Hemoglobin >10; ANC>1000, platelets>100,000; normal chromosomes)  . Easy bruising 12/18/2012    Due to MDS.  Qualitative platelet abnormality?  . Varicose veins    Past Surgical History  Procedure Laterality Date  . Prostate surgery  aug 1997    cancer  . Hernia repair  aug 2011   Family History  Problem Relation Age of Onset  . Heart disease Mother   . Heart attack Mother   . Heart disease Father    History  Substance Use Topics  . Smoking  status: Former Smoker    Quit date: 01/12/1979  . Smokeless tobacco: Not on file  . Alcohol Use: 1.2 oz/week    2 Glasses of wine per week     Comment: weekly    Review of Systems  Musculoskeletal: Positive for arthralgias. Negative for back pain and neck pain.  Neurological: Negative for dizziness, syncope and headaches.  All other systems reviewed and are negative.     Allergies  Protonix  Home Medications   Prior to Admission medications   Medication Sig Start Date End Date Taking? Authorizing Provider  Calcium Carb-Cholecalciferol (OS-CAL) 500-600 MG-UNIT CHEW Chew 1 tablet by mouth daily.    Historical Provider, MD  COENZYME Q10-RED YEAST RICE PO Take 1 tablet by mouth daily. Red yeast 612m & 1091mCQ10    Historical Provider, MD  folic acid-pyridoxine-cyancobalamin (FOLTX) 2.5-25-2 MG TABS Take 1 tablet by mouth daily. 02/16/13   JaAnnia BeltMD  Multiple Vitamins-Minerals (MULTIVITAMIN WITH MINERALS) tablet Take 1 tablet by mouth daily.      Historical Provider, MD   BP 144/65  Pulse 79  Temp(Src) 97.9 F (36.6 C) (Oral)  Resp 18  Ht 5' 9"  (1.753 m)  Wt 160 lb (72.576 kg)  BMI 23.62 kg/m2  SpO2 98% Physical Exam  Constitutional: He is oriented to person, place, and time. He appears well-developed and well-nourished. No distress.  HENT:  Head: Normocephalic and atraumatic.  Right Ear: Hearing normal.  Left Ear: Hearing normal.  Nose: Nose normal.  Mouth/Throat: Oropharynx is clear and moist and mucous membranes are normal.  Eyes: Conjunctivae and EOM are normal. Pupils are equal, round, and reactive to light.  Neck: Normal range of motion. Neck supple. No spinous process tenderness and no muscular tenderness present.  Cardiovascular: Regular rhythm, S1 normal and S2 normal.  Exam reveals no gallop and no friction rub.   No murmur heard. Pulmonary/Chest: Effort normal and breath sounds normal. No respiratory distress. He exhibits no tenderness.    Abdominal: Soft. Normal appearance and bowel sounds are normal. There is no hepatosplenomegaly. There is no tenderness. There is no rebound, no guarding, no tenderness at McBurney's point and negative Murphy's sign. No hernia.  Musculoskeletal: Normal range of motion.       Thoracic back: Normal. He exhibits no tenderness and no bony tenderness.       Lumbar back: Normal. He exhibits no tenderness and no bony tenderness (no tenderness in thelumbar, sacral, coccygeal areas midline).       Back:  Neurological: He is alert and oriented to person, place, and time. He has normal strength. No cranial nerve deficit or sensory deficit. Coordination normal. GCS eye subscore is 4. GCS verbal subscore is 5. GCS motor subscore is 6.  Skin: Skin is warm, dry and intact. No rash noted. No cyanosis.  Psychiatric: He has a normal mood and affect. His speech is normal and behavior is normal. Thought content normal.    ED Course  Procedures (including critical care time) Labs Review Labs Reviewed - No data to display  Imaging Review Dg Hip Complete Right  09/09/2013   CLINICAL DATA:  Fall last night, posterior right hip pain  EXAM: RIGHT HIP - COMPLETE 2+ VIEW  COMPARISON:  None.  FINDINGS: There is no acute fracture or dislocation. There is generalized osteopenia. There is mild right hip osteoarthritis. There are surgical clips along the pelvic sidewall bilaterally.  IMPRESSION: 1. No acute osseous injury of the right hip. 2. Mild osteoarthritis of the right hip.   Electronically Signed   By: Kathreen Devoid   On: 09/09/2013 10:14     EKG Interpretation None      MDM   Final diagnoses:  None    Patient presents to the ER for evaluation after a fall. Patient is complaining of pain in the right gluteal region after he fell. There was no evidence of midline spinal tenderness or concern for spinal fracture. He has normal neurologic exam. Patient has range of motion preserved at the hip. Pain is in the soft  tissues posteriorly, not anterior groin were read to expect for a hip injury. Patient is ambulatory here in the ER, further making hip fracture less likely.  X-ray of the hip was negative. There is no evidence of pelvic fracture seen. I do not see a need for advanced imaging of the hip, as suspicion for fracture is very low. We'll treat with rest and analgesia, followup with primary doctor in the next several days for a recheck.   Orpah Greek, MD 09/09/13 1126

## 2013-09-17 ENCOUNTER — Other Ambulatory Visit (HOSPITAL_BASED_OUTPATIENT_CLINIC_OR_DEPARTMENT_OTHER): Payer: Medicare Other

## 2013-09-17 DIAGNOSIS — D61818 Other pancytopenia: Secondary | ICD-10-CM

## 2013-09-17 DIAGNOSIS — D4620 Refractory anemia with excess of blasts, unspecified: Secondary | ICD-10-CM

## 2013-09-17 DIAGNOSIS — R233 Spontaneous ecchymoses: Secondary | ICD-10-CM

## 2013-09-17 DIAGNOSIS — R238 Other skin changes: Secondary | ICD-10-CM

## 2013-09-17 LAB — MANUAL DIFFERENTIAL
ALC: 1 10*3/uL (ref 0.9–3.3)
ANC (CHCC MAN DIFF): 1.2 10*3/uL — AB (ref 1.5–6.5)
BLASTS: 11 % — AB (ref 0–0)
Band Neutrophils: 0 % (ref 0–10)
Basophil: 0 % (ref 0–2)
EOS: 0 % (ref 0–7)
LYMPH: 40 % (ref 14–49)
MONO: 3 % (ref 0–14)
MYELOCYTES: 0 % (ref 0–0)
Metamyelocytes: 0 % (ref 0–0)
OTHER CELL: 0 % (ref 0–0)
PLT EST: ADEQUATE
PROMYELO: 0 % (ref 0–0)
SEG: 46 % (ref 38–77)
VARIANT LYMPH: 0 % (ref 0–0)
nRBC: 3 % — ABNORMAL HIGH (ref 0–0)

## 2013-09-17 LAB — CBC WITH DIFFERENTIAL/PLATELET
HEMATOCRIT: 26.5 % — AB (ref 38.4–49.9)
HGB: 7.7 g/dL — ABNORMAL LOW (ref 13.0–17.1)
MCH: 23.1 pg — ABNORMAL LOW (ref 27.2–33.4)
MCHC: 29.1 g/dL — ABNORMAL LOW (ref 32.0–36.0)
MCV: 79.3 fL (ref 79.3–98.0)
RBC: 3.34 10*6/uL — ABNORMAL LOW (ref 4.20–5.82)
RDW: 19.5 % — ABNORMAL HIGH (ref 11.0–14.6)
WBC: 2.5 10*3/uL — ABNORMAL LOW (ref 4.0–10.3)

## 2013-09-17 LAB — CHCC SMEAR

## 2013-09-18 ENCOUNTER — Telehealth: Payer: Self-pay | Admitting: *Deleted

## 2013-09-18 NOTE — Telephone Encounter (Signed)
Called pt - pt informed of Hgb of 7.7, lower than usual; repeat lab in 2 weeks and to call [phone]) for any chest pain/shortness of breath at rest per Dr Beryle Beams. Pt voiced understanding/repeated instructions. Also stated her felled about 2 weeks ago; went to [employer]. Stated he hit the bath mat; has a right buttock bruise.

## 2013-09-18 NOTE — Telephone Encounter (Signed)
Message copied by Ebbie Latus on Fri Sep 18, 2013  9:04 AM ------      Message from: Annia Belt      Created: Fri Sep 18, 2013  8:48 AM       Call pt  Blood count a little lower than ususal  Will repeat in 2 weeks let us know if any significant shortness of breath or chest pain at rest then we will give him a transfusion ------

## 2013-09-21 ENCOUNTER — Other Ambulatory Visit: Payer: Self-pay | Admitting: Oncology

## 2013-09-21 DIAGNOSIS — D61818 Other pancytopenia: Secondary | ICD-10-CM

## 2013-09-22 NOTE — Telephone Encounter (Signed)
Pt states he wants labs drawn at [facility]; Dr Beryle Beams informed.

## 2013-10-01 ENCOUNTER — Ambulatory Visit (HOSPITAL_BASED_OUTPATIENT_CLINIC_OR_DEPARTMENT_OTHER): Payer: Medicare Other

## 2013-10-01 ENCOUNTER — Telehealth: Payer: Self-pay | Admitting: *Deleted

## 2013-10-01 ENCOUNTER — Other Ambulatory Visit: Payer: Self-pay | Admitting: Oncology

## 2013-10-01 DIAGNOSIS — D4620 Refractory anemia with excess of blasts, unspecified: Secondary | ICD-10-CM

## 2013-10-01 DIAGNOSIS — D61818 Other pancytopenia: Secondary | ICD-10-CM

## 2013-10-01 LAB — CBC WITH DIFFERENTIAL/PLATELET
BASO%: 0.9 % (ref 0.0–2.0)
Basophils Absolute: 0 10*3/uL (ref 0.0–0.1)
EOS%: 0.3 % (ref 0.0–7.0)
Eosinophils Absolute: 0 10*3/uL (ref 0.0–0.5)
HCT: 28.7 % — ABNORMAL LOW (ref 38.4–49.9)
HGB: 8.5 g/dL — ABNORMAL LOW (ref 13.0–17.1)
LYMPH#: 1.1 10*3/uL (ref 0.9–3.3)
LYMPH%: 48.9 % (ref 14.0–49.0)
MCH: 23.5 pg — ABNORMAL LOW (ref 27.2–33.4)
MCHC: 29.5 g/dL — AB (ref 32.0–36.0)
MCV: 79.6 fL (ref 79.3–98.0)
MONO#: 0 10*3/uL — ABNORMAL LOW (ref 0.1–0.9)
MONO%: 1.3 % (ref 0.0–14.0)
NEUT#: 1.1 10*3/uL — ABNORMAL LOW (ref 1.5–6.5)
NEUT%: 48.6 % (ref 39.0–75.0)
PLATELETS: 222 10*3/uL (ref 140–400)
RBC: 3.61 10*6/uL — ABNORMAL LOW (ref 4.20–5.82)
RDW: 19.3 % — ABNORMAL HIGH (ref 11.0–14.6)
WBC: 2.3 10*3/uL — AB (ref 4.0–10.3)

## 2013-10-01 LAB — HOLD TUBE, BLOOD BANK

## 2013-10-01 NOTE — Telephone Encounter (Signed)
Patient in office today for lab only for follow up on low hemoglobin per Dr Beryle Beams. Patient asking to discuss results with nurse. I called and discussed with Dr Beryle Beams, since patient is not symptomatic, we will hold on any blood transfusion at this time. Orders will be entered for lab recheck in 2 weeks, and if stable, return to monthly appointments. Patient understands he will receive a call to confirm time. Patient was instructed that at anytime he develops sudden shortness of breath, chest pain or palpitations, he should seek medical attention or report to ER. Patient and wife verbalized understanding.

## 2013-10-02 ENCOUNTER — Telehealth: Payer: Self-pay | Admitting: Oncology

## 2013-10-02 NOTE — Telephone Encounter (Signed)
lvm for pt regarding to May appt....mailed pt appt sched and letter °

## 2013-10-13 ENCOUNTER — Other Ambulatory Visit: Payer: Self-pay | Admitting: Internal Medicine

## 2013-10-13 ENCOUNTER — Telehealth: Payer: Self-pay | Admitting: Oncology

## 2013-10-13 DIAGNOSIS — R609 Edema, unspecified: Secondary | ICD-10-CM

## 2013-10-13 NOTE — Telephone Encounter (Signed)
t called to r/s appt done...pt aware of new d.t

## 2013-10-14 ENCOUNTER — Ambulatory Visit
Admission: RE | Admit: 2013-10-14 | Discharge: 2013-10-14 | Disposition: A | Discharge status: Home / Self Care | Payer: Medicare Other | Source: Ambulatory Visit | Attending: Internal Medicine | Admitting: Internal Medicine

## 2013-10-14 DIAGNOSIS — R609 Edema, unspecified: Secondary | ICD-10-CM

## 2013-10-15 ENCOUNTER — Other Ambulatory Visit: Payer: Self-pay | Admitting: Oncology

## 2013-10-15 ENCOUNTER — Other Ambulatory Visit: Payer: Medicare Other

## 2013-10-16 ENCOUNTER — Other Ambulatory Visit (HOSPITAL_BASED_OUTPATIENT_CLINIC_OR_DEPARTMENT_OTHER): Payer: Medicare Other

## 2013-10-16 DIAGNOSIS — D61818 Other pancytopenia: Secondary | ICD-10-CM

## 2013-10-16 DIAGNOSIS — D4620 Refractory anemia with excess of blasts, unspecified: Secondary | ICD-10-CM

## 2013-10-16 LAB — MORPHOLOGY
Blasts: 6 % — ABNORMAL HIGH (ref 0–0)
PLT EST: ADEQUATE

## 2013-10-16 LAB — CBC WITH DIFFERENTIAL/PLATELET
BASO%: 0.7 % (ref 0.0–2.0)
BASOS ABS: 0 10*3/uL (ref 0.0–0.1)
EOS%: 0.3 % (ref 0.0–7.0)
Eosinophils Absolute: 0 10*3/uL (ref 0.0–0.5)
HCT: 31.3 % — ABNORMAL LOW (ref 38.4–49.9)
HEMOGLOBIN: 9.3 g/dL — AB (ref 13.0–17.1)
LYMPH#: 1.1 10*3/uL (ref 0.9–3.3)
LYMPH%: 31.2 % (ref 14.0–49.0)
MCH: 23.3 pg — ABNORMAL LOW (ref 27.2–33.4)
MCHC: 29.6 g/dL — ABNORMAL LOW (ref 32.0–36.0)
MCV: 78.5 fL — ABNORMAL LOW (ref 79.3–98.0)
MONO#: 0 10*3/uL — AB (ref 0.1–0.9)
MONO%: 0.1 % (ref 0.0–14.0)
NEUT%: 67.7 % (ref 39.0–75.0)
NEUTROS ABS: 2.3 10*3/uL (ref 1.5–6.5)
Platelets: 178 10*3/uL (ref 140–400)
RBC: 3.99 10*6/uL — AB (ref 4.20–5.82)
RDW: 18.7 % — AB (ref 11.0–14.6)
WBC: 3.4 10*3/uL — ABNORMAL LOW (ref 4.0–10.3)

## 2013-10-19 ENCOUNTER — Telehealth: Payer: Self-pay | Admitting: *Deleted

## 2013-10-19 NOTE — Telephone Encounter (Signed)
Message copied by Ebbie Latus on Mon Oct 19, 2013 11:12 AM ------      Message from: Annia Belt      Created: Fri Oct 16, 2013  5:18 PM       Call pt:  Blood counts improved compared with last time! ------

## 2013-10-19 NOTE — Telephone Encounter (Signed)
Called pt - pt not at home; talked to pt's wife. Pt's blood counts have improved compared to last time per Dr Beryle Beams - pt's wife will informed her husband.

## 2013-11-11 ENCOUNTER — Other Ambulatory Visit: Payer: Self-pay | Admitting: Oncology

## 2013-11-12 ENCOUNTER — Telehealth: Payer: Self-pay | Admitting: Oncology

## 2013-11-12 NOTE — Telephone Encounter (Signed)
pt called to r/s appt..done...pt aware of new d.t °

## 2013-11-27 ENCOUNTER — Other Ambulatory Visit (HOSPITAL_BASED_OUTPATIENT_CLINIC_OR_DEPARTMENT_OTHER): Payer: Medicare Other

## 2013-11-27 DIAGNOSIS — R233 Spontaneous ecchymoses: Secondary | ICD-10-CM

## 2013-11-27 DIAGNOSIS — D61818 Other pancytopenia: Secondary | ICD-10-CM

## 2013-11-27 DIAGNOSIS — R238 Other skin changes: Secondary | ICD-10-CM

## 2013-11-27 DIAGNOSIS — D4620 Refractory anemia with excess of blasts, unspecified: Secondary | ICD-10-CM

## 2013-11-27 LAB — CBC WITH DIFFERENTIAL/PLATELET
BASO%: 0.6 % (ref 0.0–2.0)
Basophils Absolute: 0 10*3/uL (ref 0.0–0.1)
EOS ABS: 0 10*3/uL (ref 0.0–0.5)
EOS%: 0 % (ref 0.0–7.0)
HEMATOCRIT: 31.8 % — AB (ref 38.4–49.9)
HGB: 9.3 g/dL — ABNORMAL LOW (ref 13.0–17.1)
LYMPH#: 0.8 10*3/uL — AB (ref 0.9–3.3)
LYMPH%: 44.2 % (ref 14.0–49.0)
MCH: 22.9 pg — ABNORMAL LOW (ref 27.2–33.4)
MCHC: 29.2 g/dL — ABNORMAL LOW (ref 32.0–36.0)
MCV: 78.1 fL — ABNORMAL LOW (ref 79.3–98.0)
MONO#: 0.2 10*3/uL (ref 0.1–0.9)
MONO%: 8.8 % (ref 0.0–14.0)
NEUT%: 46.4 % (ref 39.0–75.0)
NEUTROS ABS: 0.8 10*3/uL — AB (ref 1.5–6.5)
Platelets: 125 10*3/uL — ABNORMAL LOW (ref 140–400)
RBC: 4.07 10*6/uL — ABNORMAL LOW (ref 4.20–5.82)
RDW: 18.9 % — AB (ref 11.0–14.6)
WBC: 1.8 10*3/uL — ABNORMAL LOW (ref 4.0–10.3)
nRBC: 0 % (ref 0–0)

## 2013-11-27 LAB — MORPHOLOGY: PLT EST: DECREASED

## 2013-11-27 LAB — CHCC SMEAR

## 2013-12-03 ENCOUNTER — Telehealth: Payer: Self-pay | Admitting: *Deleted

## 2013-12-03 NOTE — Telephone Encounter (Signed)
Pt called/informed of labs - red cells stable but white count/platelets are lower than last time and will continue to watch per Dr Beryle Beams. Wanted to know about further blood draw - we will let him know per Dr Beryle Beams.

## 2013-12-03 NOTE — Telephone Encounter (Signed)
Call pt. According to chart, he is down for monthly lab - no need to check sooner Please confirm that he is on lab schedule

## 2013-12-03 NOTE — Telephone Encounter (Signed)
Message copied by Ebbie Latus on Thu Dec 03, 2013  9:58 AM ------      Message from: Annia Belt      Created: Mon Nov 30, 2013  6:35 PM       Call pt:  Red cells stable but white count and platelets lower than last time. We will continue to watch ------

## 2013-12-08 ENCOUNTER — Telehealth: Payer: Self-pay | Admitting: Oncology

## 2013-12-08 NOTE — Telephone Encounter (Signed)
Pt called and I talked to his wife - informed to continued monthly lab draws  per Dr Beryle Beams.

## 2013-12-08 NOTE — Telephone Encounter (Signed)
pt called for monthly lab...done...pt awareof d.t

## 2013-12-25 ENCOUNTER — Other Ambulatory Visit (HOSPITAL_BASED_OUTPATIENT_CLINIC_OR_DEPARTMENT_OTHER): Payer: Medicare Other

## 2013-12-25 DIAGNOSIS — D4620 Refractory anemia with excess of blasts, unspecified: Secondary | ICD-10-CM

## 2013-12-25 DIAGNOSIS — D61818 Other pancytopenia: Secondary | ICD-10-CM

## 2013-12-25 LAB — CBC WITH DIFFERENTIAL/PLATELET
BASO%: 0.5 % (ref 0.0–2.0)
Basophils Absolute: 0 10*3/uL (ref 0.0–0.1)
EOS%: 2.1 % (ref 0.0–7.0)
Eosinophils Absolute: 0 10*3/uL (ref 0.0–0.5)
HCT: 32.6 % — ABNORMAL LOW (ref 38.4–49.9)
HEMOGLOBIN: 9.6 g/dL — AB (ref 13.0–17.1)
LYMPH#: 0.8 10*3/uL — AB (ref 0.9–3.3)
LYMPH%: 39.5 % (ref 14.0–49.0)
MCH: 23.1 pg — ABNORMAL LOW (ref 27.2–33.4)
MCHC: 29.4 g/dL — AB (ref 32.0–36.0)
MCV: 78.6 fL — ABNORMAL LOW (ref 79.3–98.0)
MONO#: 0.1 10*3/uL (ref 0.1–0.9)
MONO%: 5.1 % (ref 0.0–14.0)
NEUT#: 1 10*3/uL — ABNORMAL LOW (ref 1.5–6.5)
NEUT%: 52.8 % (ref 39.0–75.0)
Platelets: 114 10*3/uL — ABNORMAL LOW (ref 140–400)
RBC: 4.15 10*6/uL — ABNORMAL LOW (ref 4.20–5.82)
RDW: 19.2 % — AB (ref 11.0–14.6)
WBC: 2 10*3/uL — ABNORMAL LOW (ref 4.0–10.3)
nRBC: 0 % (ref 0–0)

## 2013-12-25 LAB — MORPHOLOGY
BLASTS: 1 % (ref 0–0)
PLT EST: DECREASED

## 2013-12-25 NOTE — Progress Notes (Signed)
Message given to pt per Dr Beryle Beams.

## 2014-01-21 ENCOUNTER — Other Ambulatory Visit (HOSPITAL_BASED_OUTPATIENT_CLINIC_OR_DEPARTMENT_OTHER): Payer: Medicare Other

## 2014-01-21 DIAGNOSIS — D61818 Other pancytopenia: Secondary | ICD-10-CM

## 2014-01-21 DIAGNOSIS — D4620 Refractory anemia with excess of blasts, unspecified: Secondary | ICD-10-CM

## 2014-01-21 LAB — MANUAL DIFFERENTIAL
ALC: 1.2 10*3/uL (ref 0.9–3.3)
ANC (CHCC manual diff): 0.7 10*3/uL — ABNORMAL LOW (ref 1.5–6.5)
Band Neutrophils: 0 % (ref 0–10)
Basophil: 0 % (ref 0–2)
Blasts: 4 % — ABNORMAL HIGH (ref 0–0)
EOS: 0 % (ref 0–7)
LYMPH: 59 % — ABNORMAL HIGH (ref 14–49)
MONO: 0 % (ref 0–14)
MYELOCYTES: 0 % (ref 0–0)
Metamyelocytes: 0 % (ref 0–0)
Other Cell: 0 % (ref 0–0)
PLT EST: DECREASED
PROMYELO: 0 % (ref 0–0)
SEG: 36 % — AB (ref 38–77)
VARIANT LYMPH: 0 % (ref 0–0)
nRBC: 1 % — ABNORMAL HIGH (ref 0–0)

## 2014-01-21 LAB — CBC WITH DIFFERENTIAL/PLATELET
HCT: 31 % — ABNORMAL LOW (ref 38.4–49.9)
HEMOGLOBIN: 9.2 g/dL — AB (ref 13.0–17.1)
MCH: 23.4 pg — ABNORMAL LOW (ref 27.2–33.4)
MCHC: 29.7 g/dL — ABNORMAL LOW (ref 32.0–36.0)
MCV: 78.9 fL — ABNORMAL LOW (ref 79.3–98.0)
PLATELETS: 113 10*3/uL — AB (ref 140–400)
RBC: 3.93 10*6/uL — AB (ref 4.20–5.82)
RDW: 18.9 % — AB (ref 11.0–14.6)
WBC: 2 10*3/uL — AB (ref 4.0–10.3)

## 2014-01-22 ENCOUNTER — Other Ambulatory Visit: Payer: Medicare Other

## 2014-01-26 ENCOUNTER — Telehealth: Payer: Self-pay | Admitting: *Deleted

## 2014-01-26 NOTE — Telephone Encounter (Signed)
Message copied by Ebbie Latus on Tue Jan 26, 2014 11:40 AM ------      Message from: Annia Belt      Created: Fri Jan 22, 2014  3:49 AM       Call pt  Labs no change from before ------

## 2014-01-26 NOTE — Telephone Encounter (Signed)
Called pt who was not available - spoke to pt's wife; no change in labs from before per Dr Beryle Beams. Stated she will relay message to he husband.

## 2014-02-18 ENCOUNTER — Other Ambulatory Visit (HOSPITAL_BASED_OUTPATIENT_CLINIC_OR_DEPARTMENT_OTHER): Payer: Medicare Other

## 2014-02-18 DIAGNOSIS — D61818 Other pancytopenia: Secondary | ICD-10-CM

## 2014-02-18 DIAGNOSIS — D4620 Refractory anemia with excess of blasts, unspecified: Secondary | ICD-10-CM

## 2014-02-18 LAB — CBC WITH DIFFERENTIAL/PLATELET
BASO%: 0.5 % (ref 0.0–2.0)
BASOS ABS: 0 10*3/uL (ref 0.0–0.1)
EOS ABS: 0 10*3/uL (ref 0.0–0.5)
EOS%: 0 % (ref 0.0–7.0)
HEMATOCRIT: 31.7 % — AB (ref 38.4–49.9)
HEMOGLOBIN: 9.4 g/dL — AB (ref 13.0–17.1)
LYMPH#: 1.1 10*3/uL (ref 0.9–3.3)
LYMPH%: 51.5 % — ABNORMAL HIGH (ref 14.0–49.0)
MCH: 23.3 pg — ABNORMAL LOW (ref 27.2–33.4)
MCHC: 29.7 g/dL — ABNORMAL LOW (ref 32.0–36.0)
MCV: 78.7 fL — ABNORMAL LOW (ref 79.3–98.0)
MONO#: 0.1 10*3/uL (ref 0.1–0.9)
MONO%: 4.4 % (ref 0.0–14.0)
NEUT#: 0.9 10*3/uL — ABNORMAL LOW (ref 1.5–6.5)
NEUT%: 43.6 % (ref 39.0–75.0)
Platelets: 140 10*3/uL (ref 140–400)
RBC: 4.03 10*6/uL — ABNORMAL LOW (ref 4.20–5.82)
RDW: 19 % — AB (ref 11.0–14.6)
WBC: 2.1 10*3/uL — AB (ref 4.0–10.3)
nRBC: 1 % — ABNORMAL HIGH (ref 0–0)

## 2014-02-18 LAB — MORPHOLOGY
Blasts: 5 % (ref 0–0)
PLT EST: ADEQUATE

## 2014-02-19 ENCOUNTER — Telehealth: Payer: Self-pay | Admitting: *Deleted

## 2014-02-19 ENCOUNTER — Other Ambulatory Visit: Payer: Medicare Other

## 2014-02-19 NOTE — Telephone Encounter (Signed)
Called pt - wife stated pt unable to come to the telephone at this time; informed wife his lab results are consistent with previous results per Dr Beryle Beams. Stated she will let him know.

## 2014-02-19 NOTE — Telephone Encounter (Signed)
Message copied by Ebbie Latus on Fri Feb 19, 2014 10:13 AM ------      Message from: Annia Belt      Created: Thu Feb 18, 2014  5:41 PM       Call pt: lab stable c/w previous ------

## 2014-03-08 ENCOUNTER — Ambulatory Visit: Payer: Medicare Other | Admitting: Oncology

## 2014-03-08 ENCOUNTER — Encounter: Payer: Self-pay | Admitting: Oncology

## 2014-03-08 ENCOUNTER — Ambulatory Visit (INDEPENDENT_AMBULATORY_CARE_PROVIDER_SITE_OTHER): Payer: Medicare Other | Admitting: Oncology

## 2014-03-08 VITALS — BP 118/65 | HR 62 | Temp 98.1°F | Resp 20 | Ht 67.5 in | Wt 160.3 lb

## 2014-03-08 DIAGNOSIS — D4620 Refractory anemia with excess of blasts, unspecified: Secondary | ICD-10-CM

## 2014-03-08 DIAGNOSIS — D61818 Other pancytopenia: Secondary | ICD-10-CM

## 2014-03-08 DIAGNOSIS — C61 Malignant neoplasm of prostate: Secondary | ICD-10-CM

## 2014-03-08 DIAGNOSIS — D46Z Other myelodysplastic syndromes: Secondary | ICD-10-CM

## 2014-03-08 NOTE — Patient Instructions (Signed)
Continue lab at [facility]: Next Wednesday 10/28 then change to every 6 weeks starting 04/29/14 MD visit with Dr Darnell Level at [facility] in 4 months

## 2014-03-09 NOTE — Addendum Note (Signed)
Addended by: Yvonna Alanis E on: 03/09/2014 12:18 PM   Modules accepted: Orders

## 2014-03-09 NOTE — Progress Notes (Signed)
Patient ID: Jason Stafford, male   DOB: Apr 24, 1929, 80 y.o.   MRN: [id number] [name]  Jason Stafford [id number] Feb 17, 1929 80 y.o. 03/09/2014 9:28 AM   Principle Diagnosis: Encounter Diagnoses  Name Primary?  . MDS (myelodysplastic syndrome), low grade Yes  . Pancytopenia   . Prostate ca      Interim History:   Followup visit for this pleasant 80 year old man initially referred for evaluation of pancytopenia. Bone marrow biopsy done in November 2013. Marrow was hypercellular with trilineage dyspoiesis most pronounced in the megakaryocyte cell line which is curious since his platelet count was initially normal and has never fallen less than 113,000. Blast percentage upper limit of normal at 5%. Cytogenetic studies including FISH analysis for chromosome 5 and 7 abnormalities was normal. He was given a diagnosis of low risk myelodysplastic syndrome. I have been following blood counts on an every two-three-month basis. White count differentials have shown up to 8% blasts in the peripheral blood but hemoglobin, total white count, and platelet count have been relatively stable. I did put him on a multivitamins including N86, B6, and folic acid but there was no significant improvement in his counts.  There has been a trend for slowly falling hemoglobin over time. Baseline back in September 2000 1311-12 g. Current hemoglobin 9.4. Return for slowly decreasing white blood cell count with baseline at presentation 3500-3800 with current white count 2100. White count differential has remained about the same with 40-50% neutrophils, 40-50% lymphocytes, and the persistence of early myeloid and erythroid cells including myeloblasts. He has not developed any severe infections. Only new complaint today is the appearance of a painful nodule subcutaneous tissues left thigh which usually presents as ecchymosis and then resolves to a nodule. Despite fall in his hemoglobin, he  denies any dyspnea, chest pain, chest pressure, or palpitations.    Medications: reviewed  Allergies:  Allergies  Allergen Reactions  . Protonix [Pantoprazole Sodium] Diarrhea    Review of Systems: Hematology:  See above ENT ROS: No sore throat Breast ROS:  Respiratory ROS: See above no cough Cardiovascular ROS:  See above Gastrointestinal ROS: No abdominal pain or change in bowel habits. No hematochezia   Genito-Urinary ROS: Not questioned Musculoskeletal ROS: No muscle bone or joint pain Neurological ROS: No headache or change in vision Dermatological ROS: No rash. See above Remaining ROS negative:   Physical Exam: Blood pressure 118/65, pulse 62, temperature 98.1 F (36.7 C), temperature source Oral, resp. rate 20, height 5' 7.5" (1.715 m), weight 160 lb 4.8 oz (72.712 kg), SpO2 99.00%. Wt Readings from Last 3 Encounters:  03/08/14 160 lb 4.8 oz (72.712 kg)  09/09/13 160 lb (72.576 kg)  08/25/13 160 lb 1.6 oz (72.621 kg)     General appearance: Well-nourished Caucasian [name]: Pharynx no erythema, exudate, mass, or ulcer. No thyromegaly or thyroid nodules Lymph nodes: No cervical, supraclavicular, or axillary lymphadenopathy Breasts:  Lungs: Clear to auscultation, resonant to percussion throughout Heart: Regular rhythm, no murmur, no gallop, no rub, no click, no edema Abdomen: Soft, nontender, normal bowel sounds, no mass, no organomegaly Extremities: No edema, no calf tenderness Musculoskeletal: no joint deformities GU:  Vascular: Carotid pulses 2+, no bruits, Neurologic: Alert, oriented, PERRLA, optic discs sharp and vessels normal, no hemorrhage or exudate, cranial nerves grossly normal, motor strength 5 over 5, reflexes 1+ symmetric, upper body coordination normal, gait normal, Skin: Chronic, small, ecchymoses on the skin of both of his forearms. There is an erythematous,  raised, approximate 2 cm nodule palpable on the lateral proximal right thigh. No  surrounding ecchymosis.  Lab Results: CBC W/Diff    Component Value Date/Time   WBC 2.1* 02/18/2014 1447   WBC 2.2* 08/25/2013 1052   RBC 4.03* 02/18/2014 1447   RBC 4.29 08/25/2013 1052   HGB 9.4* 02/18/2014 1447   HGB 9.6* 08/25/2013 1052   HCT 31.7* 02/18/2014 1447   HCT 31.6* 08/25/2013 1052   PLT 140 02/18/2014 1447   PLT 115* 08/25/2013 1052   MCV 78.7* 02/18/2014 1447   MCV 73.7* 08/25/2013 1052   MCH 23.3* 02/18/2014 1447   MCH 22.4* 08/25/2013 1052   MCHC 29.7* 02/18/2014 1447   MCHC 30.4 08/25/2013 1052   RDW 19.0* 02/18/2014 1447   RDW 18.8* 08/25/2013 1052   LYMPHSABS 1.1 02/18/2014 1447   LYMPHSABS 1.0 08/25/2013 1052   MONOABS 0.1 02/18/2014 1447   MONOABS 0.0* 08/25/2013 1052   EOSABS 0.0 02/18/2014 1447   EOSABS 0.0 08/25/2013 1052   BASOSABS 0.0 02/18/2014 1447   BASOSABS 0.0 08/25/2013 1052     Chemistry      Component Value Date/Time   NA 140 08/25/2013 1052   NA 142 01/28/2012 1501   K 5.3 08/25/2013 1052   K 4.0 01/28/2012 1501   CL 106 08/25/2013 1052   CL 107 01/28/2012 1501   CO2 32 08/25/2013 1052   CO2 28 01/28/2012 1501   BUN 20 08/25/2013 1052   BUN 18.0 01/28/2012 1501   CREATININE 0.84 08/25/2013 1052   CREATININE 0.80 08/03/2012 1115   CREATININE 0.8 01/28/2012 1501      Component Value Date/Time   CALCIUM 8.9 08/25/2013 1052   CALCIUM 8.5 01/28/2012 1501   ALKPHOS 72 08/25/2013 1052   ALKPHOS 72 01/28/2012 1501   AST 17 08/25/2013 1052   AST 22 01/28/2012 1501   ALT 11 08/25/2013 1052   ALT 17 01/28/2012 1501   BILITOT 0.4 08/25/2013 1052   BILITOT 0.50 01/28/2012 1501         Impression:   #1. Low-intermediate risk myelodysplastic syndrome Slow trend for progressive decrease in his blood counts over time. No overt transformation to acute leukemia but persistence of blasts in the peripheral blood. He is now 80 years old. We will continue to monitor his blood counts closely. If he should progress or acute leukemia, I will need to do a literature search. We still do not have very effective therapy  for patients over the age of 78 with myeloid leukemia. If the patient is physiologically fit, I offer them either best supportive care with periodic transfusions or a trial of a hypomethylating agent such as 5-azacytidine or decitabine.    CC: Patient Care Team: Jani Gravel, MD as PCP - General (Internal Medicine)   Annia Belt, MD 10/20/20159:28 AM

## 2014-03-17 ENCOUNTER — Telehealth: Payer: Self-pay | Admitting: Oncology

## 2014-03-17 ENCOUNTER — Ambulatory Visit (HOSPITAL_BASED_OUTPATIENT_CLINIC_OR_DEPARTMENT_OTHER): Payer: Medicare Other

## 2014-03-17 DIAGNOSIS — D469 Myelodysplastic syndrome, unspecified: Secondary | ICD-10-CM

## 2014-03-17 LAB — MORPHOLOGY
BLASTS: 4 % (ref 0–0)
PLT EST: DECREASED

## 2014-03-17 LAB — COMPREHENSIVE METABOLIC PANEL (CC13)
ALK PHOS: 74 U/L (ref 40–150)
ALT: 17 U/L (ref 0–55)
ANION GAP: 5 meq/L (ref 3–11)
AST: 21 U/L (ref 5–34)
Albumin: 3.5 g/dL (ref 3.5–5.0)
BUN: 19.8 mg/dL (ref 7.0–26.0)
CO2: 29 meq/L (ref 22–29)
Calcium: 8.9 mg/dL (ref 8.4–10.4)
Chloride: 109 mEq/L (ref 98–109)
Creatinine: 0.8 mg/dL (ref 0.7–1.3)
Glucose: 94 mg/dl (ref 70–140)
Potassium: 4.6 mEq/L (ref 3.5–5.1)
Sodium: 143 mEq/L (ref 136–145)
TOTAL PROTEIN: 6.2 g/dL — AB (ref 6.4–8.3)
Total Bilirubin: 0.4 mg/dL (ref 0.20–1.20)

## 2014-03-17 LAB — CBC WITH DIFFERENTIAL/PLATELET
BASO%: 0 % (ref 0.0–2.0)
Basophils Absolute: 0 10*3/uL (ref 0.0–0.1)
EOS%: 0 % (ref 0.0–7.0)
Eosinophils Absolute: 0 10*3/uL (ref 0.0–0.5)
HEMATOCRIT: 31.3 % — AB (ref 38.4–49.9)
HGB: 9.3 g/dL — ABNORMAL LOW (ref 13.0–17.1)
LYMPH%: 44.4 % (ref 14.0–49.0)
MCH: 23.3 pg — AB (ref 27.2–33.4)
MCHC: 29.7 g/dL — AB (ref 32.0–36.0)
MCV: 78.3 fL — AB (ref 79.3–98.0)
MONO#: 0.1 10*3/uL (ref 0.1–0.9)
MONO%: 5.1 % (ref 0.0–14.0)
NEUT#: 1 10*3/uL — ABNORMAL LOW (ref 1.5–6.5)
NEUT%: 50.5 % (ref 39.0–75.0)
PLATELETS: 125 10*3/uL — AB (ref 140–400)
RBC: 4 10*6/uL — ABNORMAL LOW (ref 4.20–5.82)
RDW: 18.5 % — ABNORMAL HIGH (ref 11.0–14.6)
WBC: 2 10*3/uL — ABNORMAL LOW (ref 4.0–10.3)
lymph#: 0.9 10*3/uL (ref 0.9–3.3)
nRBC: 0 % (ref 0–0)

## 2014-03-17 LAB — LACTATE DEHYDROGENASE (CC13): LDH: 293 U/L — AB (ref 125–245)

## 2014-03-17 NOTE — Telephone Encounter (Signed)
Pt confirmed labs per 10/28 POF, gave pt AVS.... KJ

## 2014-03-18 ENCOUNTER — Telehealth: Payer: Self-pay | Admitting: *Deleted

## 2014-03-18 NOTE — Telephone Encounter (Signed)
Called pt; no answer - left message, his labs are stable, consistent w/previous labs per Dr Beryle Beams. And to call if he has any questions.

## 2014-03-18 NOTE — Telephone Encounter (Signed)
Message copied by Ebbie Latus on Thu Mar 18, 2014 12:20 PM ------      Message from: Annia Belt      Created: Thu Mar 18, 2014 10:09 AM       Call pt: lab stable c/w previous ------

## 2014-04-23 NOTE — Addendum Note (Signed)
Addended by: Truddie Crumble on: 04/23/2014 12:23 PM   Modules accepted: Orders

## 2014-04-29 ENCOUNTER — Other Ambulatory Visit (HOSPITAL_BASED_OUTPATIENT_CLINIC_OR_DEPARTMENT_OTHER): Payer: Medicare Other

## 2014-04-29 DIAGNOSIS — D4620 Refractory anemia with excess of blasts, unspecified: Secondary | ICD-10-CM

## 2014-04-29 DIAGNOSIS — C61 Malignant neoplasm of prostate: Secondary | ICD-10-CM

## 2014-04-29 DIAGNOSIS — D46Z Other myelodysplastic syndromes: Secondary | ICD-10-CM

## 2014-04-29 DIAGNOSIS — D61818 Other pancytopenia: Secondary | ICD-10-CM

## 2014-04-29 LAB — CBC WITH DIFFERENTIAL/PLATELET
BASO%: 0.3 % (ref 0.0–2.0)
Basophils Absolute: 0 10*3/uL (ref 0.0–0.1)
EOS%: 0 % (ref 0.0–7.0)
Eosinophils Absolute: 0 10*3/uL (ref 0.0–0.5)
HCT: 31.3 % — ABNORMAL LOW (ref 38.4–49.9)
HGB: 9.2 g/dL — ABNORMAL LOW (ref 13.0–17.1)
LYMPH%: 41.7 % (ref 14.0–49.0)
MCH: 22.6 pg — AB (ref 27.2–33.4)
MCHC: 29.5 g/dL — AB (ref 32.0–36.0)
MCV: 76.6 fL — ABNORMAL LOW (ref 79.3–98.0)
MONO#: 0.1 10*3/uL (ref 0.1–0.9)
MONO%: 2 % (ref 0.0–14.0)
NEUT#: 1.4 10*3/uL — ABNORMAL LOW (ref 1.5–6.5)
NEUT%: 56 % (ref 39.0–75.0)
RBC: 4.08 10*6/uL — AB (ref 4.20–5.82)
RDW: 17.9 % — ABNORMAL HIGH (ref 11.0–14.6)
WBC: 2.6 10*3/uL — ABNORMAL LOW (ref 4.0–10.3)
lymph#: 1.1 10*3/uL (ref 0.9–3.3)

## 2014-04-29 LAB — CHCC SMEAR

## 2014-04-29 LAB — TECHNOLOGIST REVIEW: Technologist Review: 10

## 2014-04-30 ENCOUNTER — Telehealth: Payer: Self-pay | Admitting: *Deleted

## 2014-04-30 NOTE — Telephone Encounter (Signed)
Pt called - no answer; left message labs are stable per Dr Beryle Beams.  And to call if he has any questions.

## 2014-04-30 NOTE — Telephone Encounter (Signed)
-----   Message from Annia Belt, MD sent at 04/29/2014  5:07 PM EST ----- Call pt lab stable

## 2014-06-10 ENCOUNTER — Other Ambulatory Visit (HOSPITAL_BASED_OUTPATIENT_CLINIC_OR_DEPARTMENT_OTHER): Payer: Medicare Other

## 2014-06-10 DIAGNOSIS — D469 Myelodysplastic syndrome, unspecified: Secondary | ICD-10-CM

## 2014-06-10 DIAGNOSIS — D4620 Refractory anemia with excess of blasts, unspecified: Secondary | ICD-10-CM

## 2014-06-10 DIAGNOSIS — D61818 Other pancytopenia: Secondary | ICD-10-CM

## 2014-06-10 LAB — CBC WITH DIFFERENTIAL/PLATELET
BASO%: 0 % (ref 0.0–2.0)
BASOS ABS: 0 10*3/uL (ref 0.0–0.1)
EOS%: 0 % (ref 0.0–7.0)
Eosinophils Absolute: 0 10*3/uL (ref 0.0–0.5)
HEMATOCRIT: 31.9 % — AB (ref 38.4–49.9)
HEMOGLOBIN: 9.3 g/dL — AB (ref 13.0–17.1)
LYMPH#: 1 10*3/uL (ref 0.9–3.3)
LYMPH%: 50.5 % — ABNORMAL HIGH (ref 14.0–49.0)
MCH: 22.7 pg — AB (ref 27.2–33.4)
MCHC: 29.2 g/dL — AB (ref 32.0–36.0)
MCV: 78 fL — AB (ref 79.3–98.0)
MONO#: 0 10*3/uL — ABNORMAL LOW (ref 0.1–0.9)
MONO%: 1.6 % (ref 0.0–14.0)
NEUT#: 0.9 10*3/uL — ABNORMAL LOW (ref 1.5–6.5)
NEUT%: 47.9 % (ref 39.0–75.0)
RBC: 4.09 10*6/uL — ABNORMAL LOW (ref 4.20–5.82)
RDW: 19.1 % — ABNORMAL HIGH (ref 11.0–14.6)
WBC: 1.9 10*3/uL — AB (ref 4.0–10.3)
nRBC: 1 % — ABNORMAL HIGH (ref 0–0)

## 2014-06-10 LAB — CHCC SMEAR

## 2014-06-10 LAB — TECHNOLOGIST REVIEW: Technologist Review: 7

## 2014-07-09 ENCOUNTER — Telehealth: Payer: Self-pay | Admitting: Oncology

## 2014-07-09 NOTE — Telephone Encounter (Signed)
Call to patient to confirm appt for 07/12/14 at 11:15 lmtcb

## 2014-07-12 ENCOUNTER — Ambulatory Visit (INDEPENDENT_AMBULATORY_CARE_PROVIDER_SITE_OTHER): Payer: Medicare Other | Admitting: Oncology

## 2014-07-12 ENCOUNTER — Encounter: Payer: Self-pay | Admitting: Oncology

## 2014-07-12 VITALS — BP 125/63 | HR 68 | Temp 97.4°F | Ht 68.0 in | Wt 159.5 lb

## 2014-07-12 DIAGNOSIS — D46Z Other myelodysplastic syndromes: Secondary | ICD-10-CM

## 2014-07-12 DIAGNOSIS — Z8546 Personal history of malignant neoplasm of prostate: Secondary | ICD-10-CM | POA: Diagnosis not present

## 2014-07-12 DIAGNOSIS — D4620 Refractory anemia with excess of blasts, unspecified: Secondary | ICD-10-CM

## 2014-07-12 NOTE — Patient Instructions (Signed)
Continue every 6 week lab checks at [facility] Visit with Dr Darnell Level at [facility] center in 4 months

## 2014-07-12 NOTE — Progress Notes (Signed)
Patient ID: Jason Stafford, male   DOB: [birth year], 81 y.o.   MRN: [id number] [name]  LABRANDON Stafford [id number] 12-Jan-1929 81 y.o. 07/12/2014 4:55 PM   Principle Diagnosis: Encounter Diagnosis  Name Primary?  . MDS (myelodysplastic syndrome), low grade Yes  clinical summary: 81 year old man initially referred for evaluation of pancytopenia. Bone marrow biopsy done in November 2013. Marrow was hypercellular with trilineage dyspoiesis most pronounced in the megakaryocyte cell line which is curious since his platelet count was initially normal and has never fallen to less than 113,000. Blast percentage upper limit of normal at 5%. Cytogenetic studies including FISH analysis for chromosome 5 and 7 abnormalities was normal. He was given a diagnosis of low risk myelodysplastic syndrome. I have been following blood counts on an every two-three-month basis. White count differentials have shown up to 8% blasts in the peripheral blood but hemoglobin, total white count, and platelet count have been relatively stable. I did put him on a multivitamins including D53, B6, and folic acid but there was no significant improvement in his counts.  There has been a trend for slowly falling hemoglobin over time. Baseline back in September 2013: 11-12 g. Current hemoglobin 9.3. Trend for slowly decreasing white blood cell count with baseline at presentation 3500-3800 with current white count 1,900 (06/10/14) . White count differential has remained about the same with 40-50% neutrophils, 40-50% lymphocytes, and the persistence of early myeloid and erythroid cells including myeloblasts. He has not developed any severe infections.   Interim History:   He continues to do well. Appetite is good. He has had no intercurrent infections. He did get the flu vaccine this season. He has not noted any abnormal bleeding. He still gets some ecchymoses on his skin. No headache, no dyspnea, no chest  pain or palpitations. No abdominal pain. No change in bowel habit.   Medications: reviewed  Allergies:  Allergies  Allergen Reactions  . Protonix [Pantoprazole Sodium] Diarrhea    Review of Systems: See history of present illness Remaining ROS negative:   Physical Exam: Blood pressure 125/63, pulse 68, temperature 97.4 F (36.3 C), temperature source Oral, height 5' 8"  (1.727 m), weight 159 lb 8 oz (72.349 kg), SpO2 100 %. Wt Readings from Last 3 Encounters:  07/12/14 159 lb 8 oz (72.349 kg)  03/08/14 160 lb 4.8 oz (72.712 kg)  09/09/13 160 lb (72.576 kg)     General appearance: thin caucasian [name]: Pharynx no erythema, exudate, mass, or ulcer. No thyromegaly or thyroid nodules Lymph nodes: No cervical, supraclavicular, or axillary lymphadenopathy Breasts:  Lungs: Clear to auscultation, resonant to percussion throughout Heart: Regular rhythm, no murmur, no gallop, no rub, no click, no edema Abdomen: Soft, nontender, normal bowel sounds, no mass, no organomegaly Extremities: No edema, no calf tenderness Musculoskeletal: no joint deformities GU:  Vascular: Carotid pulses 2+, no bruits, Neurologic: Alert, oriented, PERRLA,  cranial nerves grossly normal, motor strength 5 over 5, reflexes 1+ symmetric upper extremities. 2+ left lower extremity trace-1+ right lower extremity, upper body coordination normal, gait normal, Skin: No rash; scattered small,  Ecchymosis on the dorsum of his hands  Lab Results: CBC W/Diff    Component Value Date/Time   WBC 1.9* 06/10/2014 1450   WBC 2.2* 08/25/2013 1052   RBC 4.09* 06/10/2014 1450   RBC 4.29 08/25/2013 1052   HGB 9.3* 06/10/2014 1450   HGB 9.6* 08/25/2013 1052   HCT 31.9* 06/10/2014 1450   HCT 31.6* 08/25/2013 1052  PLT 150 Large & giant platelets 06/10/2014 1450   PLT 115* 08/25/2013 1052   MCV 78.0* 06/10/2014 1450   MCV 73.7* 08/25/2013 1052   MCH 22.7* 06/10/2014 1450   MCH 22.4* 08/25/2013 1052   MCHC 29.2*  06/10/2014 1450   MCHC 30.4 08/25/2013 1052   RDW 19.1* 06/10/2014 1450   RDW 18.8* 08/25/2013 1052   LYMPHSABS 1.0 06/10/2014 1450   LYMPHSABS 1.0 08/25/2013 1052   MONOABS 0.0* 06/10/2014 1450   MONOABS 0.0* 08/25/2013 1052   EOSABS 0.0 06/10/2014 1450   EOSABS 0.0 08/25/2013 1052   BASOSABS 0.0 06/10/2014 1450   BASOSABS 0.0 08/25/2013 1052     Chemistry      Component Value Date/Time   NA 143 03/17/2014 1518   NA 140 08/25/2013 1052   K 4.6 03/17/2014 1518   K 5.3 08/25/2013 1052   CL 106 08/25/2013 1052   CL 107 01/28/2012 1501   CO2 29 03/17/2014 1518   CO2 32 08/25/2013 1052   BUN 19.8 03/17/2014 1518   BUN 20 08/25/2013 1052   CREATININE 0.8 03/17/2014 1518   CREATININE 0.84 08/25/2013 1052   CREATININE 0.80 08/03/2012 1115      Component Value Date/Time   CALCIUM 8.9 03/17/2014 1518   CALCIUM 8.9 08/25/2013 1052   ALKPHOS 74 03/17/2014 1518   ALKPHOS 72 08/25/2013 1052   AST 21 03/17/2014 1518   AST 17 08/25/2013 1052   ALT 17 03/17/2014 1518   ALT 11 08/25/2013 1052   BILITOT 0.40 03/17/2014 1518   BILITOT 0.4 08/25/2013 1052    .  Impression:  #1. Low-intermediate risk myelodysplastic syndrome Blood counts overall stable at his baseline with no interim complications. Once again, we discussed that this is a pre-leukemic blood disorder. If he was to progress to acute myeloid leukemia, we would have limited therapeutic options but might be able to control blood counts for a short period of time. He understands this. If that situation arises, I would do a search to see if there were any available treatments that would be reasonable for him.  #2. History of treated prostate cancer Gleason 7 status post prostatectomy August 1997  CC: Patient Care Team: Jani Gravel, MD as PCP - General (Internal Medicine)   Annia Belt, MD 2/22/20164:55 PM

## 2014-07-22 ENCOUNTER — Other Ambulatory Visit (HOSPITAL_BASED_OUTPATIENT_CLINIC_OR_DEPARTMENT_OTHER): Payer: Medicare Other

## 2014-07-22 DIAGNOSIS — D469 Myelodysplastic syndrome, unspecified: Secondary | ICD-10-CM

## 2014-07-22 DIAGNOSIS — D61818 Other pancytopenia: Secondary | ICD-10-CM

## 2014-07-22 DIAGNOSIS — D4620 Refractory anemia with excess of blasts, unspecified: Secondary | ICD-10-CM

## 2014-07-22 LAB — CBC WITH DIFFERENTIAL/PLATELET
BASO%: 0.6 % (ref 0.0–2.0)
BASOS ABS: 0 10*3/uL (ref 0.0–0.1)
EOS%: 0 % (ref 0.0–7.0)
Eosinophils Absolute: 0 10*3/uL (ref 0.0–0.5)
HCT: 32.6 % — ABNORMAL LOW (ref 38.4–49.9)
HGB: 9.5 g/dL — ABNORMAL LOW (ref 13.0–17.1)
LYMPH%: 52 % — AB (ref 14.0–49.0)
MCH: 23 pg — AB (ref 27.2–33.4)
MCHC: 29.1 g/dL — AB (ref 32.0–36.0)
MCV: 78.9 fL — ABNORMAL LOW (ref 79.3–98.0)
MONO#: 0 10*3/uL — ABNORMAL LOW (ref 0.1–0.9)
MONO%: 2.3 % (ref 0.0–14.0)
NEUT#: 0.8 10*3/uL — ABNORMAL LOW (ref 1.5–6.5)
NEUT%: 45.1 % (ref 39.0–75.0)
Platelets: 125 10*3/uL — ABNORMAL LOW (ref 140–400)
RBC: 4.13 10*6/uL — AB (ref 4.20–5.82)
RDW: 19.8 % — ABNORMAL HIGH (ref 11.0–14.6)
WBC: 1.8 10*3/uL — AB (ref 4.0–10.3)
lymph#: 0.9 10*3/uL (ref 0.9–3.3)
nRBC: 0 % (ref 0–0)

## 2014-07-22 LAB — CHCC SMEAR

## 2014-07-26 ENCOUNTER — Telehealth: Payer: Self-pay | Admitting: *Deleted

## 2014-07-26 NOTE — Telephone Encounter (Signed)
-----   Message from Annia Belt, MD sent at 07/22/2014  2:51 PM EST ----- Call pt: counts no significant change from baseline

## 2014-07-26 NOTE — Telephone Encounter (Signed)
Pt called / informed no significant change in lab results "from baseline" per Dr Beryle Beams. No questions.

## 2014-09-02 ENCOUNTER — Other Ambulatory Visit (HOSPITAL_BASED_OUTPATIENT_CLINIC_OR_DEPARTMENT_OTHER): Payer: Medicare Other

## 2014-09-02 DIAGNOSIS — D469 Myelodysplastic syndrome, unspecified: Secondary | ICD-10-CM | POA: Diagnosis not present

## 2014-09-02 DIAGNOSIS — D61818 Other pancytopenia: Secondary | ICD-10-CM

## 2014-09-02 DIAGNOSIS — D4620 Refractory anemia with excess of blasts, unspecified: Secondary | ICD-10-CM

## 2014-09-02 LAB — CBC WITH DIFFERENTIAL/PLATELET
BASO%: 0.5 % (ref 0.0–2.0)
BASOS ABS: 0 10*3/uL (ref 0.0–0.1)
EOS ABS: 0 10*3/uL (ref 0.0–0.5)
EOS%: 0.5 % (ref 0.0–7.0)
HEMATOCRIT: 32.4 % — AB (ref 38.4–49.9)
HEMOGLOBIN: 9.5 g/dL — AB (ref 13.0–17.1)
LYMPH#: 0.9 10*3/uL (ref 0.9–3.3)
LYMPH%: 50 % — AB (ref 14.0–49.0)
MCH: 23.3 pg — ABNORMAL LOW (ref 27.2–33.4)
MCHC: 29.3 g/dL — ABNORMAL LOW (ref 32.0–36.0)
MCV: 79.6 fL (ref 79.3–98.0)
MONO#: 0 10*3/uL — ABNORMAL LOW (ref 0.1–0.9)
MONO%: 2.1 % (ref 0.0–14.0)
NEUT#: 0.9 10*3/uL — ABNORMAL LOW (ref 1.5–6.5)
NEUT%: 46.9 % (ref 39.0–75.0)
Platelets: 125 10*3/uL — ABNORMAL LOW (ref 140–400)
RBC: 4.07 10*6/uL — ABNORMAL LOW (ref 4.20–5.82)
RDW: 19.3 % — ABNORMAL HIGH (ref 11.0–14.6)
WBC: 1.9 10*3/uL — ABNORMAL LOW (ref 4.0–10.3)
nRBC: 1 % — ABNORMAL HIGH (ref 0–0)

## 2014-09-02 LAB — CHCC SMEAR

## 2014-09-07 ENCOUNTER — Telehealth: Payer: Self-pay | Admitting: *Deleted

## 2014-09-07 NOTE — Telephone Encounter (Signed)
-----   Message from Annia Belt, MD sent at 09/02/2014  3:31 PM EDT ----- Call pt: lab at his baseline no better/no worse

## 2014-09-07 NOTE — Telephone Encounter (Signed)
Called pt - talked to his wife; informed her,pt labs are at baseline "no better,no worse" per Dr Beryle Beams. She stated ok and will tell her husband.

## 2014-09-26 ENCOUNTER — Other Ambulatory Visit: Payer: Self-pay

## 2014-09-26 ENCOUNTER — Emergency Department (HOSPITAL_COMMUNITY): Payer: Medicare Other

## 2014-09-26 ENCOUNTER — Encounter (HOSPITAL_COMMUNITY): Payer: Self-pay | Admitting: *Deleted

## 2014-09-26 ENCOUNTER — Inpatient Hospital Stay (HOSPITAL_COMMUNITY)
Admission: EM | Admit: 2014-09-26 | Discharge: 2014-10-20 | DRG: 064 | Disposition: E | Payer: Medicare Other | Attending: Internal Medicine | Admitting: Internal Medicine

## 2014-09-26 ENCOUNTER — Other Ambulatory Visit (HOSPITAL_COMMUNITY): Payer: Self-pay

## 2014-09-26 DIAGNOSIS — T148 Other injury of unspecified body region: Secondary | ICD-10-CM | POA: Diagnosis not present

## 2014-09-26 DIAGNOSIS — E785 Hyperlipidemia, unspecified: Secondary | ICD-10-CM | POA: Diagnosis present

## 2014-09-26 DIAGNOSIS — I618 Other nontraumatic intracerebral hemorrhage: Secondary | ICD-10-CM | POA: Diagnosis not present

## 2014-09-26 DIAGNOSIS — J9690 Respiratory failure, unspecified, unspecified whether with hypoxia or hypercapnia: Secondary | ICD-10-CM | POA: Diagnosis not present

## 2014-09-26 DIAGNOSIS — R001 Bradycardia, unspecified: Secondary | ICD-10-CM | POA: Diagnosis not present

## 2014-09-26 DIAGNOSIS — Z8249 Family history of ischemic heart disease and other diseases of the circulatory system: Secondary | ICD-10-CM | POA: Diagnosis not present

## 2014-09-26 DIAGNOSIS — D61818 Other pancytopenia: Secondary | ICD-10-CM | POA: Diagnosis present

## 2014-09-26 DIAGNOSIS — R918 Other nonspecific abnormal finding of lung field: Secondary | ICD-10-CM | POA: Diagnosis not present

## 2014-09-26 DIAGNOSIS — M1610 Unilateral primary osteoarthritis, unspecified hip: Secondary | ICD-10-CM | POA: Diagnosis not present

## 2014-09-26 DIAGNOSIS — M79606 Pain in leg, unspecified: Secondary | ICD-10-CM | POA: Diagnosis not present

## 2014-09-26 DIAGNOSIS — Z87891 Personal history of nicotine dependence: Secondary | ICD-10-CM

## 2014-09-26 DIAGNOSIS — K5730 Diverticulosis of large intestine without perforation or abscess without bleeding: Secondary | ICD-10-CM | POA: Diagnosis not present

## 2014-09-26 DIAGNOSIS — G935 Compression of brain: Secondary | ICD-10-CM | POA: Diagnosis present

## 2014-09-26 DIAGNOSIS — E249 Cushing's syndrome, unspecified: Secondary | ICD-10-CM | POA: Diagnosis not present

## 2014-09-26 DIAGNOSIS — S062X0A Diffuse traumatic brain injury without loss of consciousness, initial encounter: Secondary | ICD-10-CM | POA: Diagnosis not present

## 2014-09-26 DIAGNOSIS — R4020 Unspecified coma: Secondary | ICD-10-CM | POA: Diagnosis present

## 2014-09-26 DIAGNOSIS — I774 Celiac artery compression syndrome: Secondary | ICD-10-CM | POA: Diagnosis not present

## 2014-09-26 DIAGNOSIS — M47812 Spondylosis without myelopathy or radiculopathy, cervical region: Secondary | ICD-10-CM | POA: Diagnosis not present

## 2014-09-26 DIAGNOSIS — R40243 Glasgow coma scale score 3-8: Secondary | ICD-10-CM | POA: Diagnosis not present

## 2014-09-26 DIAGNOSIS — Z66 Do not resuscitate: Secondary | ICD-10-CM | POA: Diagnosis not present

## 2014-09-26 DIAGNOSIS — D469 Myelodysplastic syndrome, unspecified: Secondary | ICD-10-CM | POA: Diagnosis not present

## 2014-09-26 DIAGNOSIS — J9600 Acute respiratory failure, unspecified whether with hypoxia or hypercapnia: Secondary | ICD-10-CM | POA: Diagnosis not present

## 2014-09-26 DIAGNOSIS — Z8546 Personal history of malignant neoplasm of prostate: Secondary | ICD-10-CM | POA: Diagnosis not present

## 2014-09-26 DIAGNOSIS — I615 Nontraumatic intracerebral hemorrhage, intraventricular: Principal | ICD-10-CM | POA: Diagnosis present

## 2014-09-26 DIAGNOSIS — Z9079 Acquired absence of other genital organ(s): Secondary | ICD-10-CM | POA: Diagnosis present

## 2014-09-26 DIAGNOSIS — R222 Localized swelling, mass and lump, trunk: Secondary | ICD-10-CM | POA: Diagnosis not present

## 2014-09-26 DIAGNOSIS — I959 Hypotension, unspecified: Secondary | ICD-10-CM | POA: Diagnosis present

## 2014-09-26 DIAGNOSIS — W1830XA Fall on same level, unspecified, initial encounter: Secondary | ICD-10-CM | POA: Diagnosis present

## 2014-09-26 DIAGNOSIS — Z4682 Encounter for fitting and adjustment of non-vascular catheter: Secondary | ICD-10-CM | POA: Diagnosis not present

## 2014-09-26 DIAGNOSIS — Z515 Encounter for palliative care: Secondary | ICD-10-CM | POA: Diagnosis not present

## 2014-09-26 DIAGNOSIS — K828 Other specified diseases of gallbladder: Secondary | ICD-10-CM | POA: Diagnosis not present

## 2014-09-26 DIAGNOSIS — I639 Cerebral infarction, unspecified: Secondary | ICD-10-CM | POA: Diagnosis not present

## 2014-09-26 HISTORY — DX: Myelodysplastic syndrome, unspecified: D46.9

## 2014-09-26 LAB — CBC WITH DIFFERENTIAL/PLATELET
BASOS ABS: 0 10*3/uL (ref 0.0–0.1)
Basophils Relative: 0 % (ref 0–1)
Eosinophils Absolute: 0 10*3/uL (ref 0.0–0.7)
Eosinophils Relative: 0 % (ref 0–5)
HEMATOCRIT: 33.1 % — AB (ref 39.0–52.0)
HEMOGLOBIN: 9.9 g/dL — AB (ref 13.0–17.0)
Lymphocytes Relative: 37 % (ref 12–46)
Lymphs Abs: 1.4 10*3/uL (ref 0.7–4.0)
MCH: 23.9 pg — ABNORMAL LOW (ref 26.0–34.0)
MCHC: 29.9 g/dL — ABNORMAL LOW (ref 30.0–36.0)
MCV: 80 fL (ref 78.0–100.0)
MONOS PCT: 2 % — AB (ref 3–12)
Monocytes Absolute: 0.1 10*3/uL (ref 0.1–1.0)
NEUTROS ABS: 2.3 10*3/uL (ref 1.7–7.7)
NEUTROS PCT: 61 % (ref 43–77)
Platelets: 160 10*3/uL (ref 150–400)
RBC: 4.14 MIL/uL — ABNORMAL LOW (ref 4.22–5.81)
RDW: 19.1 % — ABNORMAL HIGH (ref 11.5–15.5)
WBC: 3.8 10*3/uL — ABNORMAL LOW (ref 4.0–10.5)

## 2014-09-26 LAB — URINALYSIS, ROUTINE W REFLEX MICROSCOPIC
Bilirubin Urine: NEGATIVE
GLUCOSE, UA: NEGATIVE mg/dL
Ketones, ur: NEGATIVE mg/dL
LEUKOCYTES UA: NEGATIVE
NITRITE: NEGATIVE
PH: 7 (ref 5.0–8.0)
Protein, ur: NEGATIVE mg/dL
SPECIFIC GRAVITY, URINE: 1.044 — AB (ref 1.005–1.030)
Urobilinogen, UA: 0.2 mg/dL (ref 0.0–1.0)

## 2014-09-26 LAB — I-STAT CHEM 8, ED
BUN: 27 mg/dL — ABNORMAL HIGH (ref 6–20)
CALCIUM ION: 1.12 mmol/L — AB (ref 1.13–1.30)
CHLORIDE: 104 mmol/L (ref 101–111)
Creatinine, Ser: 0.9 mg/dL (ref 0.61–1.24)
GLUCOSE: 120 mg/dL — AB (ref 70–99)
HCT: 36 % — ABNORMAL LOW (ref 39.0–52.0)
Hemoglobin: 12.2 g/dL — ABNORMAL LOW (ref 13.0–17.0)
POTASSIUM: 3.9 mmol/L (ref 3.5–5.1)
Sodium: 141 mmol/L (ref 135–145)
TCO2: 21 mmol/L (ref 0–100)

## 2014-09-26 LAB — PREPARE FRESH FROZEN PLASMA
UNIT DIVISION: 0
Unit division: 0

## 2014-09-26 LAB — I-STAT TROPONIN, ED: TROPONIN I, POC: 0 ng/mL (ref 0.00–0.08)

## 2014-09-26 LAB — BASIC METABOLIC PANEL
Anion gap: 11 (ref 5–15)
BUN: 23 mg/dL — AB (ref 6–20)
CALCIUM: 8.6 mg/dL — AB (ref 8.9–10.3)
CHLORIDE: 103 mmol/L (ref 101–111)
CO2: 24 mmol/L (ref 22–32)
CREATININE: 0.84 mg/dL (ref 0.61–1.24)
Glucose, Bld: 120 mg/dL — ABNORMAL HIGH (ref 70–99)
Potassium: 3.8 mmol/L (ref 3.5–5.1)
Sodium: 138 mmol/L (ref 135–145)

## 2014-09-26 LAB — CBG MONITORING, ED: Glucose-Capillary: 119 mg/dL — ABNORMAL HIGH (ref 70–99)

## 2014-09-26 LAB — TROPONIN I

## 2014-09-26 LAB — I-STAT CG4 LACTIC ACID, ED: Lactic Acid, Venous: 3.05 mmol/L (ref 0.5–2.0)

## 2014-09-26 LAB — URINE MICROSCOPIC-ADD ON

## 2014-09-26 LAB — PROTIME-INR
INR: 1.28 (ref 0.00–1.49)
PROTHROMBIN TIME: 16.1 s — AB (ref 11.6–15.2)

## 2014-09-26 MED ORDER — SUCCINYLCHOLINE CHLORIDE 20 MG/ML IJ SOLN
INTRAMUSCULAR | Status: AC
  Filled 2014-09-26: qty 1

## 2014-09-26 MED ORDER — ROCURONIUM BROMIDE 50 MG/5ML IV SOLN
INTRAVENOUS | Status: AC
  Filled 2014-09-26: qty 2

## 2014-09-26 MED ORDER — ETOMIDATE 2 MG/ML IV SOLN
INTRAVENOUS | Status: AC | PRN
  Administered 2014-09-26: 20 mg via INTRAVENOUS

## 2014-09-26 MED ORDER — PANTOPRAZOLE SODIUM 40 MG IV SOLR
40.0000 mg | Freq: Every day | INTRAVENOUS | Status: DC
  Administered 2014-09-26: 40 mg via INTRAVENOUS
  Filled 2014-09-26 (×2): qty 40

## 2014-09-26 MED ORDER — ETOMIDATE 2 MG/ML IV SOLN
INTRAVENOUS | Status: AC
  Filled 2014-09-26: qty 20

## 2014-09-26 MED ORDER — LIDOCAINE HCL (CARDIAC) 20 MG/ML IV SOLN
INTRAVENOUS | Status: DC | PRN
  Administered 2014-09-26: 100 mg via INTRAVENOUS

## 2014-09-26 MED ORDER — IOHEXOL 300 MG/ML  SOLN: 100.0000 mL | Freq: Once | INTRAMUSCULAR | Status: DC | PRN

## 2014-09-26 MED ORDER — ONDANSETRON HCL 4 MG/2ML IJ SOLN: 4.0000 mg | Freq: Four times a day (QID) | INTRAMUSCULAR | Status: DC | PRN

## 2014-09-26 MED ORDER — SODIUM CHLORIDE 0.9 % IV SOLN
INTRAVENOUS | Status: DC
  Administered 2014-09-26: via INTRAVENOUS

## 2014-09-26 MED ORDER — LIDOCAINE HCL (CARDIAC) 20 MG/ML IV SOLN
INTRAVENOUS | Status: AC
  Filled 2014-09-26: qty 5

## 2014-09-26 MED ORDER — NALOXONE HCL 1 MG/ML IJ SOLN
1.0000 mg | Freq: Once | INTRAMUSCULAR | Status: AC
  Administered 2014-09-26: 1 mg via INTRAVENOUS

## 2014-09-26 MED ORDER — CETYLPYRIDINIUM CHLORIDE 0.05 % MT LIQD
7.0000 mL | Freq: Four times a day (QID) | OROMUCOSAL | Status: DC
  Administered 2014-09-27: 7 mL via OROMUCOSAL

## 2014-09-26 MED ORDER — SUCCINYLCHOLINE CHLORIDE 20 MG/ML IJ SOLN
INTRAMUSCULAR | Status: DC | PRN
  Administered 2014-09-26: 100 mg via INTRAVENOUS

## 2014-09-26 MED ORDER — CHLORHEXIDINE GLUCONATE 0.12 % MT SOLN
15.0000 mL | Freq: Two times a day (BID) | OROMUCOSAL | Status: DC
  Administered 2014-09-26 – 2014-09-27 (×2): 15 mL via OROMUCOSAL
  Filled 2014-09-26 (×2): qty 15

## 2014-09-26 MED ORDER — NALOXONE HCL 1 MG/ML IJ SOLN
INTRAMUSCULAR | Status: AC
  Filled 2014-09-26: qty 2

## 2014-09-26 NOTE — Consult Note (Signed)
Reason for Consult:  Intraventricular hemorrhage Referring Physician:  Drs. Rogers Blocker and [place] (EDPs)  Jason Stafford is an 81 y.o. right-handed white male.  HPI: Patient with history of prostate cancer and myelodysplastic syndrome (followed by Dr. Murriel Hopper from hematology) who was in his usual state of health until about 1600 this afternoon, when taking a shower, he began to feel ill. He complained to his wife of nausea, and then began to repeatedly vomit and developed diaphoresis. He then passed out, and became lethargic. Patient's wife contacted 911 and EMS transported the patient to [facility] emergency room, where he was evaluated by Drs. Wolfe and [place].  Dr. Rogers Blocker describes that patient had a GCS of 3/15 on arrival. He was subsequently intubated by the staff and underwent workup with CT. CT of the head revealed a large intraventricular hemorrhage extending through the lateral ventricles, third ventricle, and fourth ventricle, with prominent enlargement of the ventricles, but also evidence of generalized cerebral atrophy with persistence of increased sulcal space. Neurosurgical consultation was requested regarding this devastating stroke.  Past Medical History:  Past Medical History  Diagnosis Date  . Myelodysplastic syndrome   . Pancytopenia   . Hyperlipidemia     Past Surgical History:  Past Surgical History  Procedure Laterality Date  . Prostatectomy      Family History:  There is a family history of heart disease.  Social History: Patient has never smoked, he has a rare glass of wine, there is no history of substance abuse  Allergies: No Known Allergies  Medications: I have reviewed the patient's current medications.  ROS:  Unobtainable due to patient's comatose state.  Physical Examination: Elderly white male, intubated, on a ventilator;  he has received no sedation following the drugs given at the time of intubation. Blood pressure 134/68,  pulse 48, resp. rate 16, height 6' 1"  (1.854 m), weight 81.647 kg (180 lb), SpO2 100 %. Skin: Areas of petechial hemorrhage on various aspects of the torso and extremities.  Not opening eyes to voice or pain. Pupils 2 mm bilaterally, round, nonreactive to light. Weak blink reflex bilaterally. No doll's eyes.    Results for orders placed or performed during the hospital encounter of 09/21/2014 (from the past 48 hour(s))  Type and screen     Status: None   Collection Time: 09/25/2014  6:04 PM  Result Value Ref Range   ABO/RH(D) PENDING    Antibody Screen PENDING    Sample Expiration 09/29/2014    Unit Number [id number]    Blood Component Type RED CELLS,LR    Unit division 00    Status of Unit REL FROM [facility]    Unit tag comment VERBAL ORDERS PER DR WOLFE    Transfusion Status OK TO TRANSFUSE    Crossmatch Result PENDING    Unit Number [id number]    Blood Component Type RED CELLS,LR    Unit division 00    Status of Unit REL FROM [facility]    Unit tag comment VERBAL ORDERS PER DR WOLFE    Transfusion Status OK TO TRANSFUSE    Crossmatch Result PENDING   Prepare fresh frozen plasma     Status: None   Collection Time: 10/02/2014  6:04 PM  Result Value Ref Range   Unit Number [id number]    Blood Component Type THAWED PLASMA    Unit division 00    Status of Unit REL FROM [facility]    Unit tag comment VERBAL ORDERS PER DR Rogers Blocker  Transfusion Status OK TO TRANSFUSE    Unit Number [id number]    Blood Component Type THAWED PLASMA    Unit division 00    Status of Unit REL FROM [facility]    Unit tag comment VERBAL ORDERS PER DR WOLFE    Transfusion Status OK TO TRANSFUSE   CBG monitoring, ED     Status: Abnormal   Collection Time: 09/30/2014  6:28 PM  Result Value Ref Range   Glucose-Capillary 119 (H) 70 - 99 mg/dL  CBC with Differential/Platelet     Status: Abnormal   Collection Time: 09/25/2014  6:32 PM  Result Value Ref Range   WBC 3.8 (L) 4.0 - 10.5 K/uL   RBC 4.14 (L) 4.22 - 5.81  MIL/uL   Hemoglobin 9.9 (L) 13.0 - 17.0 g/dL   HCT 33.1 (L) 39.0 - 52.0 %   MCV 80.0 78.0 - 100.0 fL   MCH 23.9 (L) 26.0 - 34.0 pg   MCHC 29.9 (L) 30.0 - 36.0 g/dL   RDW 19.1 (H) 11.5 - 15.5 %   Platelets 160 150 - 400 K/uL    Comment: REPEATED TO VERIFY PLATELET COUNT CONFIRMED BY SMEAR    Neutrophils Relative % 61 43 - 77 %   Lymphocytes Relative 37 12 - 46 %   Monocytes Relative 2 (L) 3 - 12 %   Eosinophils Relative 0 0 - 5 %   Basophils Relative 0 0 - 1 %   Neutro Abs 2.3 1.7 - 7.7 K/uL   Lymphs Abs 1.4 0.7 - 4.0 K/uL   Monocytes Absolute 0.1 0.1 - 1.0 K/uL   Eosinophils Absolute 0.0 0.0 - 0.7 K/uL   Basophils Absolute 0.0 0.0 - 0.1 K/uL   RBC Morphology TARGET CELLS   Basic metabolic panel     Status: Abnormal   Collection Time: 10/01/2014  6:32 PM  Result Value Ref Range   Sodium 138 135 - 145 mmol/L   Potassium 3.8 3.5 - 5.1 mmol/L   Chloride 103 101 - 111 mmol/L   CO2 24 22 - 32 mmol/L   Glucose, Bld 120 (H) 70 - 99 mg/dL   BUN 23 (H) 6 - 20 mg/dL   Creatinine, Ser 0.84 0.61 - 1.24 mg/dL   Calcium 8.6 (L) 8.9 - 10.3 mg/dL   GFR calc non Af Amer >60 >60 mL/min   GFR calc Af Amer >60 >60 mL/min    Comment: (NOTE) The eGFR has been calculated using the CKD EPI equation. This calculation has not been validated in all clinical situations. eGFR's persistently <60 mL/min signify possible Chronic Kidney Disease.    Anion gap 11 5 - 15  Protime-INR     Status: Abnormal   Collection Time: 09/25/2014  6:32 PM  Result Value Ref Range   Prothrombin Time 16.1 (H) 11.6 - 15.2 seconds   INR 1.28 0.00 - 1.49  Troponin I     Status: None   Collection Time: 09/20/2014  6:32 PM  Result Value Ref Range   Troponin I <0.03 <0.031 ng/mL    Comment:        NO INDICATION OF MYOCARDIAL INJURY.   I-stat troponin, ED     Status: None   Collection Time: 10/07/2014  6:36 PM  Result Value Ref Range   Troponin i, poc 0.00 0.00 - 0.08 ng/mL   Comment 3            Comment: Due to the release  kinetics of cTnI, a negative result within  the first hours of the onset of symptoms does not rule out myocardial infarction with certainty. If myocardial infarction is still suspected, repeat the test at appropriate intervals.   I-stat chem 8, ed     Status: Abnormal   Collection Time: 10/13/2014  6:38 PM  Result Value Ref Range   Sodium 141 135 - 145 mmol/L   Potassium 3.9 3.5 - 5.1 mmol/L   Chloride 104 101 - 111 mmol/L   BUN 27 (H) 6 - 20 mg/dL   Creatinine, Ser 0.90 0.61 - 1.24 mg/dL   Glucose, Bld 120 (H) 70 - 99 mg/dL   Calcium, Ion 1.12 (L) 1.13 - 1.30 mmol/L   TCO2 21 0 - 100 mmol/L   Hemoglobin 12.2 (L) 13.0 - 17.0 g/dL   HCT 36.0 (L) 39.0 - 52.0 %  I-Stat CG4 Lactic Acid, ED     Status: Abnormal   Collection Time: 09/21/2014  6:39 PM  Result Value Ref Range   Lactic Acid, Venous 3.05 (HH) 0.5 - 2.0 mmol/L   Comment NOTIFIED PHYSICIAN   Urinalysis, Routine w reflex microscopic     Status: Abnormal   Collection Time: 10/16/2014  7:20 PM  Result Value Ref Range   Color, Urine YELLOW YELLOW   APPearance CLEAR CLEAR   Specific Gravity, Urine 1.044 (H) 1.005 - 1.030   pH 7.0 5.0 - 8.0   Glucose, UA NEGATIVE NEGATIVE mg/dL   Hgb urine dipstick SMALL (A) NEGATIVE   Bilirubin Urine NEGATIVE NEGATIVE   Ketones, ur NEGATIVE NEGATIVE mg/dL   Protein, ur NEGATIVE NEGATIVE mg/dL   Urobilinogen, UA 0.2 0.0 - 1.0 mg/dL   Nitrite NEGATIVE NEGATIVE   Leukocytes, UA NEGATIVE NEGATIVE  Urine microscopic-add on     Status: None   Collection Time: 09/25/2014  7:20 PM  Result Value Ref Range   RBC / HPF 0-2 <3 RBC/hpf   Urine-Other AMORPHOUS URATES/PHOSPHATES     Ct Head Wo Contrast  10/14/2014   CLINICAL DATA:  Collapsed after getting out of the shower. Ventilator support.  EXAM: CT HEAD WITHOUT CONTRAST  CT CERVICAL SPINE WITHOUT CONTRAST  TECHNIQUE: Multidetector CT imaging of the head and cervical spine was performed following the standard protocol without intravenous contrast.  Multiplanar CT image reconstructions of the cervical spine were also generated.  COMPARISON:  None.  FINDINGS: CT HEAD FINDINGS  There is intraventricular blood filling the fourth ventricle, third ventricle, the right lateral ventricle all and about half of the left lateral ventricle. The blood is separating/ loculated in, suggesting that it may be several hours old rather than hyper acute. Usually, this pattern of hemorrhage derive is from a medial thalamic hemorrhage that perforates into the ventricle, and I think that is likely the case on the right. There is no diffuse subarachnoid blood. No large intraparenchymal hemorrhage. The brain shows atrophy and chronic small-vessel disease of the white matter. The ventricles are enlarged, likely baseline, but possibly with an element of hydrocephalus. No skull fracture. No traumatic fluid in the sinuses, middle ears or mastoids.  CT CERVICAL SPINE FINDINGS  Alignment is normal. No fracture. Ordinary mild spondylosis at C3-4, C4-5, C5-6 and C6-7 an ordinary facet arthropathy on the left at C7-T1.  IMPRESSION: Head CT: Large amount of intraventricular blood diffusely. Favored to originate from a right thalamic medial hemorrhage. See above for full discussion.  Cervical spine CT: No acute or traumatic finding. Ordinary mild degenerative changes.  Critical Value/emergent results were called by telephone at the time of  interpretation on 10/14/2014 at 7:26 pm to Dr. Ezequiel Essex , who verbally acknowledged these results.   Electronically Signed   By: Nelson Chimes M.D.   On: 10/10/2014 19:28   Ct Chest W Contrast  09/25/2014   CLINICAL DATA:  Collapsed after getting out of shower.  EXAM: CT CHEST, ABDOMEN, AND PELVIS WITH CONTRAST  TECHNIQUE: Multidetector CT imaging of the chest, abdomen and pelvis was performed following the standard protocol during bolus administration of intravenous contrast.  CONTRAST:  100 mL Omnipaque 300 intravenous  COMPARISON:  None.  FINDINGS: CT  CHEST FINDINGS  There is no pneumothorax. There is no effusion. There are mild ground-glass opacities in the posterior bases which may be atelectatic. Central airways are patent. There is mild interstitial fluid or thickening, uncertain chronicity.  There is a 3.9 cm anterior mediastinal mass to the right of midline, with calcifications. No other mediastinal mass or adenopathy is evident. The anterior mediastinal mass is directly posterior to the right internal mammary artery and vein but does not appear to involve these vascular structures.  The thoracic aorta is normal in caliber and intact. There is no dissection. There is no aneurysm. There is mild atherosclerotic calcification. The pulmonary arteries are not optimally opacified but there is no evidence of significant pulmonary embolism.  CT ABDOMEN AND PELVIS FINDINGS  There are normal appearances of the liver, spleen, pancreas, adrenals and kidneys. Bowel is remarkable only for mild colonic diverticulosis in a large volume of colonic stool. There is mural calcification of the gallbladder (porcelain gallbladder). No gallbladder calculus or mass is evident. There is no bile duct dilatation.  The abdominal aorta is normal in caliber and intact. There is no dissection. There is no aneurysm. There is mild atherosclerotic calcification. 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 The superior mesenteric artery has a plaque just beyond its origin, 30% diameter reduction. The inferior mesenteric artery is patent. The renal arteries are normal in caliber and intact. The iliac arterial vasculature is normal in caliber and intact with mild atherosclerotic calcification.  No acute findings are evident in the abdomen or pelvis.  No significant skeletal lesions are evident. Moderate degenerative disc changes are present from L1 through L5.  IMPRESSION: 1. 3.9 cm anterior mediastinal mass to the  right of midline, with calcifications. Differential considerations include germ-cell tumor (teratoma), thymoma, lymphoma. 2. No acute vascular abnormalities are evident in the chest, abdomen or pelvis. 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 3. Porcelain gallbladder 4. Diverticulosis   Electronically Signed   By: Andreas Newport M.D.   On: 09/20/2014 19:40   Ct Cervical Spine Wo Contrast  10/17/2014   CLINICAL DATA:  Collapsed after getting out of the shower. Ventilator support.  EXAM: CT HEAD WITHOUT CONTRAST  CT CERVICAL SPINE WITHOUT CONTRAST  TECHNIQUE: Multidetector CT imaging of the head and cervical spine was performed following the standard protocol without intravenous contrast. Multiplanar CT image reconstructions of the cervical spine were also generated.  COMPARISON:  None.  FINDINGS: CT HEAD FINDINGS  There is intraventricular blood filling the fourth ventricle, third ventricle, the right lateral ventricle all and about half of the left lateral ventricle. The blood is separating/ loculated in, suggesting that it may be several hours old rather than hyper acute. Usually, this pattern of hemorrhage derive is from a medial thalamic hemorrhage that perforates into the ventricle,  and I think that is likely the case on the right. There is no diffuse subarachnoid blood. No large intraparenchymal hemorrhage. The brain shows atrophy and chronic small-vessel disease of the white matter. The ventricles are enlarged, likely baseline, but possibly with an element of hydrocephalus. No skull fracture. No traumatic fluid in the sinuses, middle ears or mastoids.  CT CERVICAL SPINE FINDINGS  Alignment is normal. No fracture. Ordinary mild spondylosis at C3-4, C4-5, C5-6 and C6-7 an ordinary facet arthropathy on the left at C7-T1.  IMPRESSION: Head CT: Large amount of intraventricular blood diffusely. Favored to originate from a right thalamic medial hemorrhage. See above  for full discussion.  Cervical spine CT: No acute or traumatic finding. Ordinary mild degenerative changes.  Critical Value/emergent results were called by telephone at the time of interpretation on 09/21/2014 at 7:26 pm to Dr. Ezequiel Essex , who verbally acknowledged these results.   Electronically Signed   By: Nelson Chimes M.D.   On: 10/12/2014 19:28   Ct Abdomen Pelvis W Contrast  10/11/2014   CLINICAL DATA:  Collapsed after getting out of shower.  EXAM: CT CHEST, ABDOMEN, AND PELVIS WITH CONTRAST  TECHNIQUE: Multidetector CT imaging of the chest, abdomen and pelvis was performed following the standard protocol during bolus administration of intravenous contrast.  CONTRAST:  100 mL Omnipaque 300 intravenous  COMPARISON:  None.  FINDINGS: CT CHEST FINDINGS  There is no pneumothorax. There is no effusion. There are mild ground-glass opacities in the posterior bases which may be atelectatic. Central airways are patent. There is mild interstitial fluid or thickening, uncertain chronicity.  There is a 3.9 cm anterior mediastinal mass to the right of midline, with calcifications. No other mediastinal mass or adenopathy is evident. The anterior mediastinal mass is directly posterior to the right internal mammary artery and vein but does not appear to involve these vascular structures.  The thoracic aorta is normal in caliber and intact. There is no dissection. There is no aneurysm. There is mild atherosclerotic calcification. The pulmonary arteries are not optimally opacified but there is no evidence of significant pulmonary embolism.  CT ABDOMEN AND PELVIS FINDINGS  There are normal appearances of the liver, spleen, pancreas, adrenals and kidneys. Bowel is remarkable only for mild colonic diverticulosis in a large volume of colonic stool. There is mural calcification of the gallbladder (porcelain gallbladder). No gallbladder calculus or mass is evident. There is no bile duct dilatation.  The abdominal aorta is  normal in caliber and intact. There is no dissection. There is no aneurysm. There is mild atherosclerotic calcification. 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 The superior mesenteric artery has a plaque just beyond its origin, 30% diameter reduction. The inferior mesenteric artery is patent. The renal arteries are normal in caliber and intact. The iliac arterial vasculature is normal in caliber and intact with mild atherosclerotic calcification.  No acute findings are evident in the abdomen or pelvis.  No significant skeletal lesions are evident. Moderate degenerative disc changes are present from L1 through L5.  IMPRESSION: 1. 3.9 cm anterior mediastinal mass to the right of midline, with calcifications. Differential considerations include germ-cell tumor (teratoma), thymoma, lymphoma. 2. No acute vascular abnormalities are evident in the chest, abdomen or pelvis. 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 3. Porcelain gallbladder 4. Diverticulosis   Electronically Signed   By: Valerie Roys.D.  On: 10/08/2014 19:40   Dg Pelvis Portable  10/18/2014   CLINICAL DATA:  Loss of consciousness in the shower, unresponsive, intubation  EXAM: PORTABLE PELVIS 1-2 VIEWS  COMPARISON:  Portable exam 1833 hours without priors for comparison  FINDINGS: Diffuse osseous demineralization.  Scattered pelvic surgical clips.  No acute fracture, dislocation or bone destruction.  Degenerative disc and facet disease changes at visualized lower lumbar spine.  Proximal LEFT femur incompletely visualized.  IMPRESSION: No acute osseous abnormalities.   Electronically Signed   By: Lavonia Dana M.D.   On: 09/29/2014 18:42   Dg Chest Portable 1 View  10/09/2014   CLINICAL DATA:  81 year old male with a history of loss of consciousness  EXAM: PORTABLE CHEST - 1 VIEW  COMPARISON:  None.  FINDINGS:  Cardiomediastinal silhouette within normal limits. No evidence of pulmonary vascular congestion.  Superior aspects of the Landa excluded.  No large pleural effusion.  No confluent airspace disease.  Interstitial opacities, most pronounced at the apices, partially excluded.  Endotracheal tube is in position, which terminates approximately 1.8 cm above the carina.  Overlying EKG leads  IMPRESSION: Endotracheal tube in position, terminating 1.8 cm above the carina. This could be withdrawn slightly for better position.  No evidence of lobar pneumonia, with interstitial opacities, potentially atelectasis/scarring.  These results were called by telephone at the time of interpretation on 10/10/2014 at 6:50 pm to Dr. Ezequiel Essex , who verbally acknowledged these results.  Signed,  Dulcy Fanny. Earleen Newport, DO  [facility]  [facility]   Electronically Signed   By: Corrie Mckusick D.O.   On: 10/14/2014 18:50     Assessment/Plan: Elderly male with a history of prostate cancer 15 years ago and undergoing care for myelodysplastic syndrome, who late this afternoon suddenly developed nausea, vomiting and diaphoresis, and then collapsed.  Exam per EDP on arrival showed a GCS of 3/15. Patient currently deeply comatose, with a GCS of 3/15, with minimal brainstem reflex function. CT the brain without contrast shows a large intraventricular hemorrhage, with prominent ventricles.  I spoke with the patient's wife at length at the bedside, and reviewed his CT scan with her. I discussed the nature of his devastating stroke and the futileness of intervention. I did offer the option of considering placement of an intraventricular catheter, however I explained that I feel that in consideration of the severity of his stroke that it would not benefit him. After discussing this thoroughly with her, and answering her questions, she does not wish for [place] to proceed with any such  intervention.  Patient is being admitted to the critical care medicine service for further care. Unfortunately I don't feel that there is anything that would benefit him from a neurosurgical perspective.  Hosie Spangle, MD 10/06/2014, 8:27 PM

## 2014-09-26 NOTE — ED Provider Notes (Signed)
CSN: [id number]     Arrival date & time 10/11/2014  1815 History   First MD Initiated Contact with Patient 10/08/2014 1829     No chief complaint on file.    (Consider location/radiation/quality/duration/timing/severity/associated sxs/prior Treatment) Patient is a 81 y.o. male presenting with fall. The history is provided by the EMS personnel. The history is limited by the condition of the patient.  Fall This is a new problem. The current episode started today. Episode frequency: once. The problem has been unchanged. Associated symptoms include vomiting. Associated symptoms comments: Pt vomited x 2 per EMS;  Unable to obtain further history due to pt's mental status. Nothing aggravates the symptoms. He has tried nothing for the symptoms. The treatment provided no relief.    Past Medical History  Diagnosis Date  . Myelodysplastic syndrome   . Pancytopenia   . Hyperlipidemia    Past Surgical History  Procedure Laterality Date  . Prostatectomy     History reviewed. No pertinent family history. History  Substance Use Topics  . Smoking status: Former Research scientist (life sciences)  . Smokeless tobacco: Not on file  . Alcohol Use: Not on file    Review of Systems  Unable to perform ROS: Mental status change  Gastrointestinal: Positive for vomiting.      Allergies  Review of patient's allergies indicates no known allergies.  Home Medications   Prior to Admission medications   Medication Sig Start Date End Date Taking? Authorizing Provider  calcium carbonate (OS-CAL - DOSED IN MG OF ELEMENTAL CALCIUM) 1250 (500 CA) MG tablet Take 1 tablet by mouth daily.   Yes Historical Provider, MD  clindamycin (CLEOCIN) 150 MG capsule Take 150 mg by mouth 3 (three) times daily.   Yes Historical Provider, MD  folic acid-pyridoxine-cyancobalamin (FOLTX) 2.5-25-2 MG TABS Take 1 tablet by mouth daily.   Yes Historical Provider, MD  MAGNESIUM PO Take 1 tablet by mouth daily.   Yes Historical Provider, MD  Multiple  Vitamins-Minerals (MULTIVITAMIN WITH MINERALS) tablet Take 1 tablet by mouth daily.   Yes Historical Provider, MD   BP 69/37 mmHg  Pulse 61  Temp(Src) 99.5 F (37.5 C) (Core (Comment))  Resp 18  Ht 5\' 10"  (1.778 m)  Wt 156 lb 15.5 oz (71.2 kg)  BMI 22.52 kg/m2  SpO2 100% Physical Exam  Constitutional: He appears well-developed and well-nourished. No distress.  HENT:  Head: Normocephalic and atraumatic.  Eyes:  Pupils 1 mm and non-reactive bilaterally  Neck:  c-collar in place  Cardiovascular: Regular rhythm, normal heart sounds and intact distal pulses.  Bradycardia present.  Exam reveals no gallop and no friction rub.   No murmur heard. Pulmonary/Chest: Effort normal and breath sounds normal. No respiratory distress. He has no wheezes. He has no rales.  Abdominal: Soft. Bowel sounds are normal. He exhibits no distension. There is no rebound and no guarding.  Musculoskeletal: He exhibits no edema.  Neurological: He is unresponsive. GCS eye subscore is 1. GCS verbal subscore is 1. GCS motor subscore is 1.  Skin: Skin is warm and dry. No rash noted. He is not diaphoretic. No erythema. No pallor.  Nursing note and vitals reviewed.   ED Course  INTUBATION Date/Time: 10/10/2014 8:00 PM Performed by: Ellwood Dense Authorized by: Ezequiel Essex Consent: The procedure was performed in an emergent situation. Patient identity confirmed: arm band Time out: Immediately prior to procedure a "time out" was called to verify the correct patient, procedure, equipment, support staff and site/side marked as required. Indications: airway protection  Intubation method: video-assisted Patient status: paralyzed (RSI) Pretreatment medications: lidocaine Sedatives: etomidate Paralytic: succinylcholine Laryngoscope size: Mac 3 Tube size: 7.5 mm Tube type: cuffed Number of attempts: 1 Cricoid pressure: no Cords visualized: yes Post-procedure assessment: chest rise and ETCO2 monitor Breath  sounds: equal and absent over the epigastrium Cuff inflated: yes ETT to lip: 23 cm Tube secured with: ETT holder Chest x-ray interpreted by me and radiologist. Chest x-ray findings: endotracheal tube in appropriate position Patient tolerance: Patient tolerated the procedure well with no immediate complications   (including critical care time) Labs Review Labs Reviewed  CBC WITH DIFFERENTIAL/PLATELET - Abnormal; Notable for the following:    WBC 3.8 (*)    RBC 4.14 (*)    Hemoglobin 9.9 (*)    HCT 33.1 (*)    MCH 23.9 (*)    MCHC 29.9 (*)    RDW 19.1 (*)    Monocytes Relative 2 (*)    All other components within normal limits  BASIC METABOLIC PANEL - Abnormal; Notable for the following:    Glucose, Bld 120 (*)    BUN 23 (*)    Calcium 8.6 (*)    All other components within normal limits  PROTIME-INR - Abnormal; Notable for the following:    Prothrombin Time 16.1 (*)    All other components within normal limits  URINALYSIS, ROUTINE W REFLEX MICROSCOPIC - Abnormal; Notable for the following:    Specific Gravity, Urine 1.044 (*)    Hgb urine dipstick SMALL (*)    All other components within normal limits  CBC - Abnormal; Notable for the following:    Hemoglobin 10.0 (*)    HCT 32.7 (*)    MCV 77.5 (*)    MCH 23.7 (*)    RDW 18.6 (*)    Platelets 148 (*)    All other components within normal limits  BASIC METABOLIC PANEL - Abnormal; Notable for the following:    Glucose, Bld 134 (*)    BUN 22 (*)    All other components within normal limits  I-STAT CG4 LACTIC ACID, ED - Abnormal; Notable for the following:    Lactic Acid, Venous 3.05 (*)    All other components within normal limits  I-STAT CHEM 8, ED - Abnormal; Notable for the following:    BUN 27 (*)    Glucose, Bld 120 (*)    Calcium, Ion 1.12 (*)    Hemoglobin 12.2 (*)    HCT 36.0 (*)    All other components within normal limits  CBG MONITORING, ED - Abnormal; Notable for the following:    Glucose-Capillary 119  (*)    All other components within normal limits  MRSA PCR SCREENING  TROPONIN I  URINE MICROSCOPIC-ADD ON  I-STAT TROPOININ, ED  PREPARE FRESH FROZEN PLASMA    Imaging Review No results found.   EKG Interpretation None      MDM   Final diagnoses:  IVH (intraventricular hemorrhage)    81 yo M with PMH of myelodysplastic syndrome presenting as Level I trauma s/p fall from standing.  Per EMS pt came out of shower, told wife he did not feel well, then began vomiting and fell onto her.  Unknown head injury.  GCS 3 on EMS arrival, vitals stable.  NPA placed and pt put on O2 via NRB, transferred for further management.  Unknown if pt uses anti-coagulants.  On arrival, pt has GCS 3; pupils 1 mm, non-reactive.  No hypoxia.  Pt bradycardic in 50s.  Normotensive.  Pt intubated for airway protection/GCS.  No other evidence of trauma on secondary survey.  Suspect CVA may have occurred prior to fall, however per Trauma team will obtain full trauma scans.  FAST exam negative.  Imaging significant for very large intraventricular hemorrhage- likely this preceded fall.  NSU consulted- non-survivable bleed, no surgical intervention advised.  Wife informed, pt made DNR.  Pt admitted to ICU, no other acute events during my care.  Discussed with attending Dr. Wyvonnia Dusky.  Ellwood Dense, MD 09/29/14 [place], MD 09/30/14 1044

## 2014-09-26 NOTE — ED Notes (Signed)
Spoke with [place] donor Ron[place] and Shanon Brow [phone],   The request to be called once formal testing is completed and cardiac death occurs.

## 2014-09-26 NOTE — Progress Notes (Addendum)
Report called to RT on 43M. Pt will be transported to 43M13 on ventilator.

## 2014-09-26 NOTE — ED Notes (Signed)
No response to narcan

## 2014-09-26 NOTE — Progress Notes (Signed)
Orthopedic Tech Progress Note Patient Details:  Jason Stafford 07/05/1928 [id number]  Patient ID: [name], male   DOB: May 07, 1929, 81 y.o.   MRN: [id number]   Irish Elders 10/15/2014, 7:14 PMLevel 1 trauma

## 2014-09-26 NOTE — Progress Notes (Signed)
Chaplain paged to visit pt spouse of 62 years.   Mrs. Gibeault expressed that "we are all each other has". They have no family, all of them have passed away.   They are from [place] and moved here 10 years ago. Reports that pt is not a sickly man, walks 40 min on the treadmill daily and is usually full of zest.   She is very expressive about the time they have spent together, " I have plenty of wonderful memories, I just hope we have the chance to make a few more"   Mrs. Gervacio is now bedside.   Delford Field, Chaplain 09/25/2014

## 2014-09-26 NOTE — ED Notes (Addendum)
Neurosurgeon at bedside speaking with spouse.

## 2014-09-26 NOTE — H&P (Signed)
PULMONARY / CRITICAL CARE MEDICINE   Name: Jason Stafford MRN: [id number] DOB: 1929/05/04    ADMISSION DATE:  09/25/2014  REFERRING MD :  EDP  CHIEF COMPLAINT:  ICH  INITIAL PRESENTATION: 81 yo male with hx prostate cancer, myelodysplastic syndrome, pancytopenia presented after collapsing while getting out of the shower.  [place] on top of his wife, did not hit his head.  GCS remained 3 on arrival, pt intubated.  CT head showed large intraventricular bleed.  PCCM called to admit.   STUDIES:  CT chest/abd/pelvis 5/9>>> 1. 3.9 cm anterior mediastinal mass to the right of midline, with calcifications. Differential considerations include germ-cell tumor (teratoma), thymoma, lymphoma. 2. No acute vascular abnormalities are evident in the chest, abdomen or pelvis. 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 3. Porcelain gallbladder 4. Diverticulosis CT head 5/8>>> Large amount of intraventricular blood diffusely. Favored to originate from a right thalamic medial hemorrhage. See above for full discussion.  SIGNIFICANT EVENTS:   HISTORY OF PRESENT ILLNESS:  81yo male with hx prostate cancer, myelodysplastic syndrome, pancytopenia presented after collapsing while getting out of the shower. Per wife he was in his usual state of great health (walks 6 days a week on the treadmill for exercise, takes only folate, B12, Ca daily) until this evening.  He began c/o malaise, started vomiting and got in the shower.  Called out to his wife and when she got in the bathroom he collapsed onto her trying to get out of the shower.  Regained some consciousness briefly but remained confused, then became comatose and GCS remained 3 on arrival.  Per wife he denied chest pain, headache, fevers, chills.  He had started clindamycin 3 days ago r/t a dental infection.    PAST MEDICAL HISTORY :   has a past medical history of Myelodysplastic syndrome; Pancytopenia; and Hyperlipidemia.   has past surgical history that includes Prostatectomy. Prior to Admission medications   Medication Sig Start Date End Date Taking? Authorizing Provider  calcium carbonate (OS-CAL - DOSED IN MG OF ELEMENTAL CALCIUM) 1250 (500 CA) MG tablet Take 1 tablet by mouth daily.   Yes Historical Provider, MD  clindamycin (CLEOCIN) 150 MG capsule Take 150 mg by mouth 3 (three) times daily.   Yes Historical Provider, MD  folic acid-pyridoxine-cyancobalamin (FOLTX) 2.5-25-2 MG TABS Take 1 tablet by mouth daily.   Yes Historical Provider, MD  MAGNESIUM PO Take 1 tablet by mouth daily.   Yes Historical Provider, MD  Multiple Vitamins-Minerals (MULTIVITAMIN WITH MINERALS) tablet Take 1 tablet by mouth daily.   Yes Historical Provider, MD   No Known Allergies  FAMILY HISTORY:  has no family status information on file.  SOCIAL HISTORY:    REVIEW OF SYSTEMS:  Unable.  Obtained from wife as per HPI.   SUBJECTIVE: unresponsive  VITAL SIGNS: Pulse Rate:  [48-66] 48 (05/08 2015) Resp:  [16-23] 16 (05/08 2015) BP: (120-144)/(63-69) 134/68 mmHg (05/08 2015) SpO2:  [100 %] 100 % (05/08 2015) FiO2 (%):  [100 %] 100 % (05/08 1954) Weight:  [180 lb (81.647 kg)] 180 lb (81.647 kg) (05/08 2018) HEMODYNAMICS:   VENTILATOR SETTINGS: Vent Mode:  [-] PRVC FiO2 (%):  [100 %] 100 % Set Rate:  [16 bmp] 16 bmp Vt Set:  [620 mL] 620 mL PEEP:  [5 cmH20] 5 cmH20 Plateau Pressure:  [14 cmH20-15 cmH20] 15 cmH20 INTAKE / OUTPUT:  Intake/Output Summary (Last 24 hours) at 09/26/14 2111 Last data  filed at 09/25/2014 2024  Gross per 24 hour  Intake    200 ml  Output    100 ml  Net    100 ml    PHYSICAL EXAMINATION: General:  Comatose elderly male  Neuro:  Comatose, no response, pupils pinpoint, no gag, did receive etomidate and succ for intubation approx 2 hours prior to my exam  Repeat exam now 900 am  C/w brain death no cough, no gag, no movement pin, pupil fixed dilated, no dolls eyes, no drive resp HEENT:  Mm  moist, pale, ETT  Cardiovascular:  s1s2 rrr, intermittent bradycardia  Lungs:  resps even, non labored on full vent support, few scattered rhonchi R?L  Abdomen:  Round, soft, bs hypo Musculoskeletal:  Warm and dry, few petechiae bilat shoulders and arms, normal per wife r/t his pancytopenia/myelodisplastic sx  LABS:  CBC  Recent Labs Lab 10/09/2014 1832 10/18/2014 1838  WBC 3.8*  --   HGB 9.9* 12.2*  HCT 33.1* 36.0*  PLT 160  --    Coag's  Recent Labs Lab 09/19/2014 1832  INR 1.28   BMET  Recent Labs Lab 09/30/2014 1832 10/03/2014 1838  NA 138 141  K 3.8 3.9  CL 103 104  CO2 24  --   BUN 23* 27*  CREATININE 0.84 0.90  GLUCOSE 120* 120*   Electrolytes  Recent Labs Lab 10/03/2014 1832  CALCIUM 8.6*   Sepsis Markers  Recent Labs Lab 10/13/2014 1839  LATICACIDVEN 3.05*   ABG No results for input(s): PHART, PCO2ART, PO2ART in the last 168 hours. Liver Enzymes No results for input(s): AST, ALT, ALKPHOS, BILITOT, ALBUMIN in the last 168 hours. Cardiac Enzymes  Recent Labs Lab 10/10/2014 1832  TROPONINI <0.03   Glucose  Recent Labs Lab 09/21/2014 1828  GLUCAP 119*    Imaging No results found.   ASSESSMENT / PLAN:  PULMONARY OETT 5/8>>> Acute respiratory failure - r/t neurologic injury ?aspiration  P:   Cont vent support  F/u ABG was put on hold, unlikely required for comfort care F/u CXR , ett wnl See discussion below   CARDIOVASCULAR Bradycardia - mild  P:  DNR, no pressors, no CVL  Pos balance  RENAL No active issue  P:   F/u chem am  LA repeat if continued support  GASTROINTESTINAL Vomiting - resolved.  P:   OG tube  PPI   HEMATOLOGIC Myelodysplastic syndrome  Leukopenia  P:  F/u cbc  SCDs  Keep NPO status  INFECTIOUS No active issue  P:   Hold off on empiric abx for ?aspiration - per wife vomiting stopped well before he lost consciousness  CT neg  ENDOCRINE No active issue  P:   F/u glucose on chem    NEUROLOGIC IVH - devastating.  Dr. Sherwood Gambler consulting, d/w wife who agreed that placement of IVC likely futile in this situation and her husband would not want that.  P:   RASS goal: 0 PRN fentanyl   FAMILY  - Updates:  Discussed with wife at length at bedside.  She has good insight and is processing that this is likely not survivable.  She states that her husband would never want to "be supported on machines" and has lived a good, healthy long life.  She is grateful in a way that he "went so quickly".  They have been married 70 years.  Unfortunately she is alone, they have no children or family and she is still a bit shocked.  She would not want CPR/Defib/pressors  or any invasive procedures so I have made hime a DNR.  She is not quite ready to discuss withdrawal and wants to "see how he does tonight", but will likely want to withdraw in the am.       Nickolas Madrid, NP 09/28/2014  9:11 PM Pager: [phone] or [phone]   STAFF NOTE: Linwood Dibbles, MD FACP have personally reviewed patient's available data, including medical history, events of note, physical examination and test results as part of my evaluation. I have discussed with resident/NP and other care providers such as pharmacist, RN and RRT. In addition, I personally evaluated patient and elicited key findings of: unresponsive status, life threatening bleed, poor prognosis, CTA, no role ABX, d/w wife, consider immediate comfort care, dc abg, assess drive and start morphine after d/w wife The patient is critically ill with multiple organ systems failure and requires high complexity decision making for assessment and support, frequent evaluation and titration of therapies, application of advanced monitoring technologies and extensive interpretation of multiple databases.   Critical Care Time devoted to patient care services described in this note is30 Minutes. This time reflects time of care of this signee: Merrie Roof, MD FACP. This critical care time does not reflect procedure time, or teaching time or supervisory time of PA/NP/Med student/Med Resident etc but could involve care discussion time. Rest per NP/medical resident whose note is outlined above and that I agree with   Lavon Paganini. Titus Mould, [place] Pgr: [facility] Oct 08, 2014 9:01 AM   Extensive discussion with family wife.  We discussed the poor prognosis and likely poor quality of life. Family has decided to offer full comfort care. They are aware that the patient may be transferred to palliative care floor for continued comfort care needs. They have been fully updated on the process and expectations. We discussed process of comfort care  Lavon Paganini. Titus Mould, MD, [place] Pgr: [facility]

## 2014-09-26 NOTE — ED Notes (Signed)
Dr Sherwood Gambler at bedside and explaining prognosis to wife, NP with critical care seening patient for [facility]

## 2014-09-26 NOTE — Consult Note (Signed)
Reason for Consult:possible fall Referring Physician: Dr Twanna Hy is an 81 y.o. male.  HPI: 44 yom who apparently was getting out of shower and sounds like he had syncope or collapsed. [place] on his wife per ems, no notation of hitting his head. Arrives gcs of 3 unable to obtain any history  No past medical history on file.  No past surgical history on file.  No family history on file.  Social History:  has no tobacco, alcohol, and drug history on file.  Allergies unknown Meds unknonw  Results for orders placed or performed during the hospital encounter of 10/08/2014 (from the past 48 hour(s))  Type and screen     Status: None (Preliminary result)   Collection Time: 09/22/2014  6:04 PM  Result Value Ref Range   ABO/RH(D) PENDING    Antibody Screen PENDING    Sample Expiration 09/29/2014    Unit Number [id number]    Blood Component Type RED CELLS,LR    Unit division 00    Status of Unit ISSUED    Unit tag comment VERBAL ORDERS PER DR WOLFE    Transfusion Status OK TO TRANSFUSE    Crossmatch Result PENDING    Unit Number [id number]    Blood Component Type RED CELLS,LR    Unit division 00    Status of Unit ISSUED    Unit tag comment VERBAL ORDERS PER DR WOLFE    Transfusion Status OK TO TRANSFUSE    Crossmatch Result PENDING   Prepare fresh frozen plasma     Status: None (Preliminary result)   Collection Time: 09/30/2014  6:04 PM  Result Value Ref Range   Unit Number [id number]    Blood Component Type THAWED PLASMA    Unit division 00    Status of Unit ISSUED    Unit tag comment VERBAL ORDERS PER DR WOLFE    Transfusion Status OK TO TRANSFUSE    Unit Number [id number]    Blood Component Type THAWED PLASMA    Unit division 00    Status of Unit ISSUED    Unit tag comment VERBAL ORDERS PER DR WOLFE    Transfusion Status OK TO TRANSFUSE   CBG monitoring, ED     Status: Abnormal   Collection Time: 10/01/2014  6:28 PM  Result Value Ref Range   Glucose-Capillary 119 (H) 70 - 99 mg/dL  I-stat troponin, ED     Status: None   Collection Time: 09/28/2014  6:36 PM  Result Value Ref Range   Troponin i, poc 0.00 0.00 - 0.08 ng/mL   Comment 3            Comment: Due to the release kinetics of cTnI, a negative result within the first hours of the onset of symptoms does not rule out myocardial infarction with certainty. If myocardial infarction is still suspected, repeat the test at appropriate intervals.   I-stat chem 8, ed     Status: Abnormal   Collection Time: 10/18/2014  6:38 PM  Result Value Ref Range   Sodium 141 135 - 145 mmol/L   Potassium 3.9 3.5 - 5.1 mmol/L   Chloride 104 101 - 111 mmol/L   BUN 27 (H) 6 - 20 mg/dL   Creatinine, Ser 0.90 0.61 - 1.24 mg/dL   Glucose, Bld 120 (H) 70 - 99 mg/dL   Calcium, Ion 1.12 (L) 1.13 - 1.30 mmol/L   TCO2 21 0 - 100 mmol/L   Hemoglobin 12.2 (L) 13.0 - 17.0  g/dL   HCT 36.0 (L) 39.0 - 52.0 %  I-Stat CG4 Lactic Acid, ED     Status: Abnormal   Collection Time: 09/22/2014  6:39 PM  Result Value Ref Range   Lactic Acid, Venous 3.05 (HH) 0.5 - 2.0 mmol/L   Comment NOTIFIED PHYSICIAN     Dg Pelvis Portable  09/30/2014   CLINICAL DATA:  Loss of consciousness in the shower, unresponsive, intubation  EXAM: PORTABLE PELVIS 1-2 VIEWS  COMPARISON:  Portable exam 1833 hours without priors for comparison  FINDINGS: Diffuse osseous demineralization.  Scattered pelvic surgical clips.  No acute fracture, dislocation or bone destruction.  Degenerative disc and facet disease changes at visualized lower lumbar spine.  Proximal LEFT femur incompletely visualized.  IMPRESSION: No acute osseous abnormalities.   Electronically Signed   By: Lavonia Dana M.D.   On: 10/04/2014 18:42   Dg Chest Portable 1 View  09/25/2014   CLINICAL DATA:  81 year old male with a history of loss of consciousness  EXAM: PORTABLE CHEST - 1 VIEW  COMPARISON:  None.  FINDINGS: Cardiomediastinal silhouette within normal limits. No evidence of  pulmonary vascular congestion.  Superior aspects of the [place] excluded.  No large pleural effusion.  No confluent airspace disease.  Interstitial opacities, most pronounced at the apices, partially excluded.  Endotracheal tube is in position, which terminates approximately 1.8 cm above the carina.  Overlying EKG leads  IMPRESSION: Endotracheal tube in position, terminating 1.8 cm above the carina. This could be withdrawn slightly for better position.  No evidence of lobar pneumonia, with interstitial opacities, potentially atelectasis/scarring.  These results were called by telephone at the time of interpretation on 10/02/2014 at 6:50 pm to Dr. Ezequiel Essex , who verbally acknowledged these results.  Signed,  Dulcy Fanny. Earleen Newport, DO  [facility]  [facility]   Electronically Signed   By: Corrie Mckusick D.O.   On: 10/01/2014 18:50    Review of Systems  Unable to perform ROS: intubated   Blood pressure 137/67, pulse 60, resp. rate 23, SpO2 100 %. Physical Exam  Constitutional: He appears well-developed and well-nourished.  HENT:  Head: Normocephalic and atraumatic.  Right Ear: External ear normal.  Left Ear: External ear normal.  Eyes:  Pupils constricted   Neck:  Collar in place  Cardiovascular: Regular rhythm.  Bradycardia present.   Respiratory: Breath sounds normal.  Some bruising over left  Clavicle   GI: Soft. Bowel sounds are normal.    Musculoskeletal: He exhibits no edema or tenderness.  Neurological: GCS eye subscore is 1. GCS verbal subscore is 1. GCS motor subscore is 1.    Assessment/Plan: Fall  It appears on scans that he has a stroke and no real trauma.  Recommend eval by nsurg and ccm    Medford Staheli 09/26/2014, 7:04 PM

## 2014-09-27 ENCOUNTER — Encounter (HOSPITAL_COMMUNITY): Payer: Self-pay | Admitting: *Deleted

## 2014-09-27 ENCOUNTER — Inpatient Hospital Stay (HOSPITAL_COMMUNITY): Payer: Medicare Other

## 2014-09-27 DIAGNOSIS — I615 Nontraumatic intracerebral hemorrhage, intraventricular: Principal | ICD-10-CM

## 2014-09-27 LAB — BASIC METABOLIC PANEL
Anion gap: 11 (ref 5–15)
BUN: 22 mg/dL — AB (ref 6–20)
CO2: 24 mmol/L (ref 22–32)
CREATININE: 0.81 mg/dL (ref 0.61–1.24)
Calcium: 8.9 mg/dL (ref 8.9–10.3)
Chloride: 104 mmol/L (ref 101–111)
Glucose, Bld: 134 mg/dL — ABNORMAL HIGH (ref 70–99)
Potassium: 3.7 mmol/L (ref 3.5–5.1)
Sodium: 139 mmol/L (ref 135–145)

## 2014-09-27 LAB — CBC
HEMATOCRIT: 32.7 % — AB (ref 39.0–52.0)
HEMOGLOBIN: 10 g/dL — AB (ref 13.0–17.0)
MCH: 23.7 pg — ABNORMAL LOW (ref 26.0–34.0)
MCHC: 30.6 g/dL (ref 30.0–36.0)
MCV: 77.5 fL — AB (ref 78.0–100.0)
Platelets: 148 10*3/uL — ABNORMAL LOW (ref 150–400)
RBC: 4.22 MIL/uL (ref 4.22–5.81)
RDW: 18.6 % — ABNORMAL HIGH (ref 11.5–15.5)
WBC: 6 10*3/uL (ref 4.0–10.5)

## 2014-09-27 LAB — MRSA PCR SCREENING: MRSA by PCR: NEGATIVE

## 2014-09-27 MED ORDER — SODIUM CHLORIDE 0.9 % IV BOLUS (SEPSIS)
1000.0000 mL | Freq: Once | INTRAVENOUS | Status: AC
  Administered 2014-09-27: 1000 mL via INTRAVENOUS

## 2014-09-27 MED ORDER — MORPHINE SULFATE 2 MG/ML IJ SOLN
INTRAMUSCULAR | Status: AC
  Filled 2014-09-27: qty 3

## 2014-09-27 MED ORDER — SODIUM CHLORIDE 0.9 % IV SOLN
INTRAVENOUS | Status: DC | PRN
  Administered 2014-09-26: 1000 mL via INTRAVENOUS

## 2014-09-27 MED ORDER — MORPHINE SULFATE 4 MG/ML IJ SOLN
5.0000 mg | INTRAMUSCULAR | Status: DC | PRN
  Administered 2014-09-27: 5 mg via INTRAVENOUS

## 2014-09-27 MED ORDER — LABETALOL HCL 5 MG/ML IV SOLN
10.0000 mg | INTRAVENOUS | Status: DC | PRN
  Filled 2014-09-27: qty 4

## 2014-09-27 MED ORDER — ACETAMINOPHEN 160 MG/5ML PO SOLN: 650.0000 mg | Freq: Four times a day (QID) | ORAL | Status: DC | PRN

## 2014-10-11 NOTE — Discharge Summary (Addendum)
NAME:  Jason Stafford, Jason Stafford NO.:  [id number]  MEDICAL RECORD NO.:  [id number]  LOCATION:                                FACILITY:  [facility]  PHYSICIAN:  Raylene Miyamoto, MD DATE OF BIRTH:  02-Feb-1929  DATE OF ADMISSION:  09/26/2014 DATE OF DISCHARGE:  10/08/14                              DISCHARGE SUMMARY   DEATH SUMMARY  This is an 81 year old male with a history of prostate cancer, myelodysplastic syndrome, pancytopenia, presented after collapsing while getting out of the shower.  He fell on top of his wife.  He did not have any head trauma.  His Glasgow coma score remained __________ on arrival and required intubation.  A CT scan of the head showed massive large intracranial and intraventricular bleed.  Critical Care was called for admission.  The patient received scan of the chest, abdomen, and pelvis on 10-08-2014, which showed 2.9 cm anterior mediastinal mass to the right of midline with  calcification __________ germ cell tumor, thymoma, lymphoma.  CT scan of the head as described with a diffuse large intraventricular bleed originating from the right thalamic medial hemorrhagic area.  Essentially, the patient will be managed on a ventilator machine with minimal __________ question aspiration.  The patient was made a DNR with no pressors, no central line wished.  The patient had bradycardia likely secondary to Cushing's syndrome.  Dr. Sherwood Gambler was consulting, who agreed that placement of IVC drain would be futile and he did not want any heroic measures __________ the patient __________ herniation, comfort care was initiated.  The patient expired.  FINAL DIAGNOSES UPON DEATH: 1. Large intraventricular hemorrhage with clinical herniation 2. Acute respiratory failure. 3. Rule out aspiration. 4. A 4 cm inferior mediastinal mass. 5. Myelodysplastic syndrome. 6. Prostate cancer.     Raylene Miyamoto, MD     DJF/MEDQ  D:  10/10/2014  T:  10/11/2014  Job:   [id number]

## 2014-10-14 ENCOUNTER — Other Ambulatory Visit: Payer: Medicare Other

## 2014-10-20 NOTE — Progress Notes (Signed)
Patient seems comfortable. Given 5mg  of morphine. Patients spouse at bedside, appropriate and accepting of situation. Support given.

## 2014-10-20 NOTE — Progress Notes (Signed)
Chaplain responded to spiritual care consult for end of life. Upon arrival pt had already passed and family left the floor. Chaplain signing off.  Allyanna Appleman, Barbette Hair, Chaplain 10/21/2014 10:37 AM

## 2014-10-20 NOTE — Progress Notes (Addendum)
Pt's SBP decreased to the 50s.  Pt's pupils are now both size 5, round, and non-reactive.  MD (CCM-Sommer) notified.  MD to place order. MD updated pt's wife over the phone regarding pt's prognosis.  Will continue to monitor.

## 2014-10-20 NOTE — Progress Notes (Signed)
UR completed.  Pt with ICH deemed nonsurvivable. Has been extubated. CM to cont to follow for whether hospice services required.  Sandi Mariscal, RN BSN [place] [facility] Trauma/Neuro ICU Case Manager [phone]

## 2014-10-20 NOTE — Progress Notes (Addendum)
Speech Language Pathology Discharge Patient Details Name: Jason Stafford MRN: [id number] DOB: [birth year] Today's Date: 09/30/14 Time:  -     Patient discharged from SLP services secondary to intubated; comfort care.  Please see latest therapy progress note for current level of functioning and progress toward goals.    Progress and discharge plan discussed with patient and/or caregiver: pt unable to participate  GO     [name] 2014/09/30, 9:21 AM   Orbie Pyo Colvin Caroli.Ed [employer] [phone]

## 2014-10-20 NOTE — Progress Notes (Signed)
eLink Physician-Brief Progress Note Patient Name: JAIVEER PANAS DOB: [birth year] MRN: [id number]   Date of Service  Oct 21, 2014  HPI/Events of Note  BP has precipitously dropped to 50/34 and pupils are dilated and fixed. I suspect he is in the process of herniation.   eICU Interventions  Will order: 1. 0.9 NaCl 1 liter IV over 1 hour now.   Spoke with wife via phone. She is very reasonable and understands that this is a non survivable event and further aggressive treatment would only prolong her husband's suffering. In addition, she states that her husband would not want to be kept alive on machines in his current state. The patient is already a DNR. She agrees  not pursue further aggressive treatment of hypotension beyond the fluid bolus.      Intervention Category Major Interventions: Hypotension - evaluation and management Minor Interventions: Communication with other healthcare providers and/or family  Lysle Dingwall 10/21/2014, 6:17 AM

## 2014-10-20 NOTE — Progress Notes (Addendum)
Pt's pupils noted to be unequal.  Right pupil is a size 5, round, and non-reactive; left pupil is a size 3, round, and non-reactive.  Pt's SBP increasing to 170s and temperature is 100.9.  MD (CCM-Sommer) notified.  MD to place orders.  Will continue to monitor.

## 2014-10-20 NOTE — Procedures (Signed)
Extubation Procedure Note  Patient Details:   Name: Jason Stafford DOB: 21-Feb-1929 MRN: [id number]   Pt terminally extubated per MD order, no  Distress noted.  Evaluation  O2 sats: stable throughout Complications: No apparent complications Patient did not tolerate procedure well. Bilateral Breath Sounds: Diminished Suctioning: Oral, Airway No  Jason Stafford 2014-09-29, 9:39 AM

## 2014-10-20 NOTE — Progress Notes (Signed)
eLink Physician-Brief Progress Note Patient Name: Jason Stafford DOB: [birth year] MRN: [id number]   Date of Service  October 21, 2014  HPI/Events of Note  Multiple issues: 1. SBP 170 and 2. Temp = 101.0 F.  eICU Interventions  Will order: 1. Lebatolol 10 mg IV Q 1 hour PRN SBP > 170. 2. Tylenol liquid 650 mg via enteral tube Q 6 hours PRN Temp > 101.5 F.     Intervention Category Major Interventions: Hypertension - evaluation and management Minor Interventions: Routine modifications to care plan (e.g. PRN medications for pain, fever)  Sommer,Steven Eugene 10/21/14, 5:03 AM

## 2014-10-20 DEATH — deceased

## 2014-11-25 ENCOUNTER — Other Ambulatory Visit: Payer: Medicare Other

## 2014-12-18 IMAGING — CR DG ANKLE COMPLETE 3+V*R*
3 series · 3 of 3 positions shown · non-contrast
Comparison: None

CLINICAL DATA: Pain and swelling for several days.

RIGHT ANKLE - COMPLETE 3+ VIEW

[t ankle joint lat right]
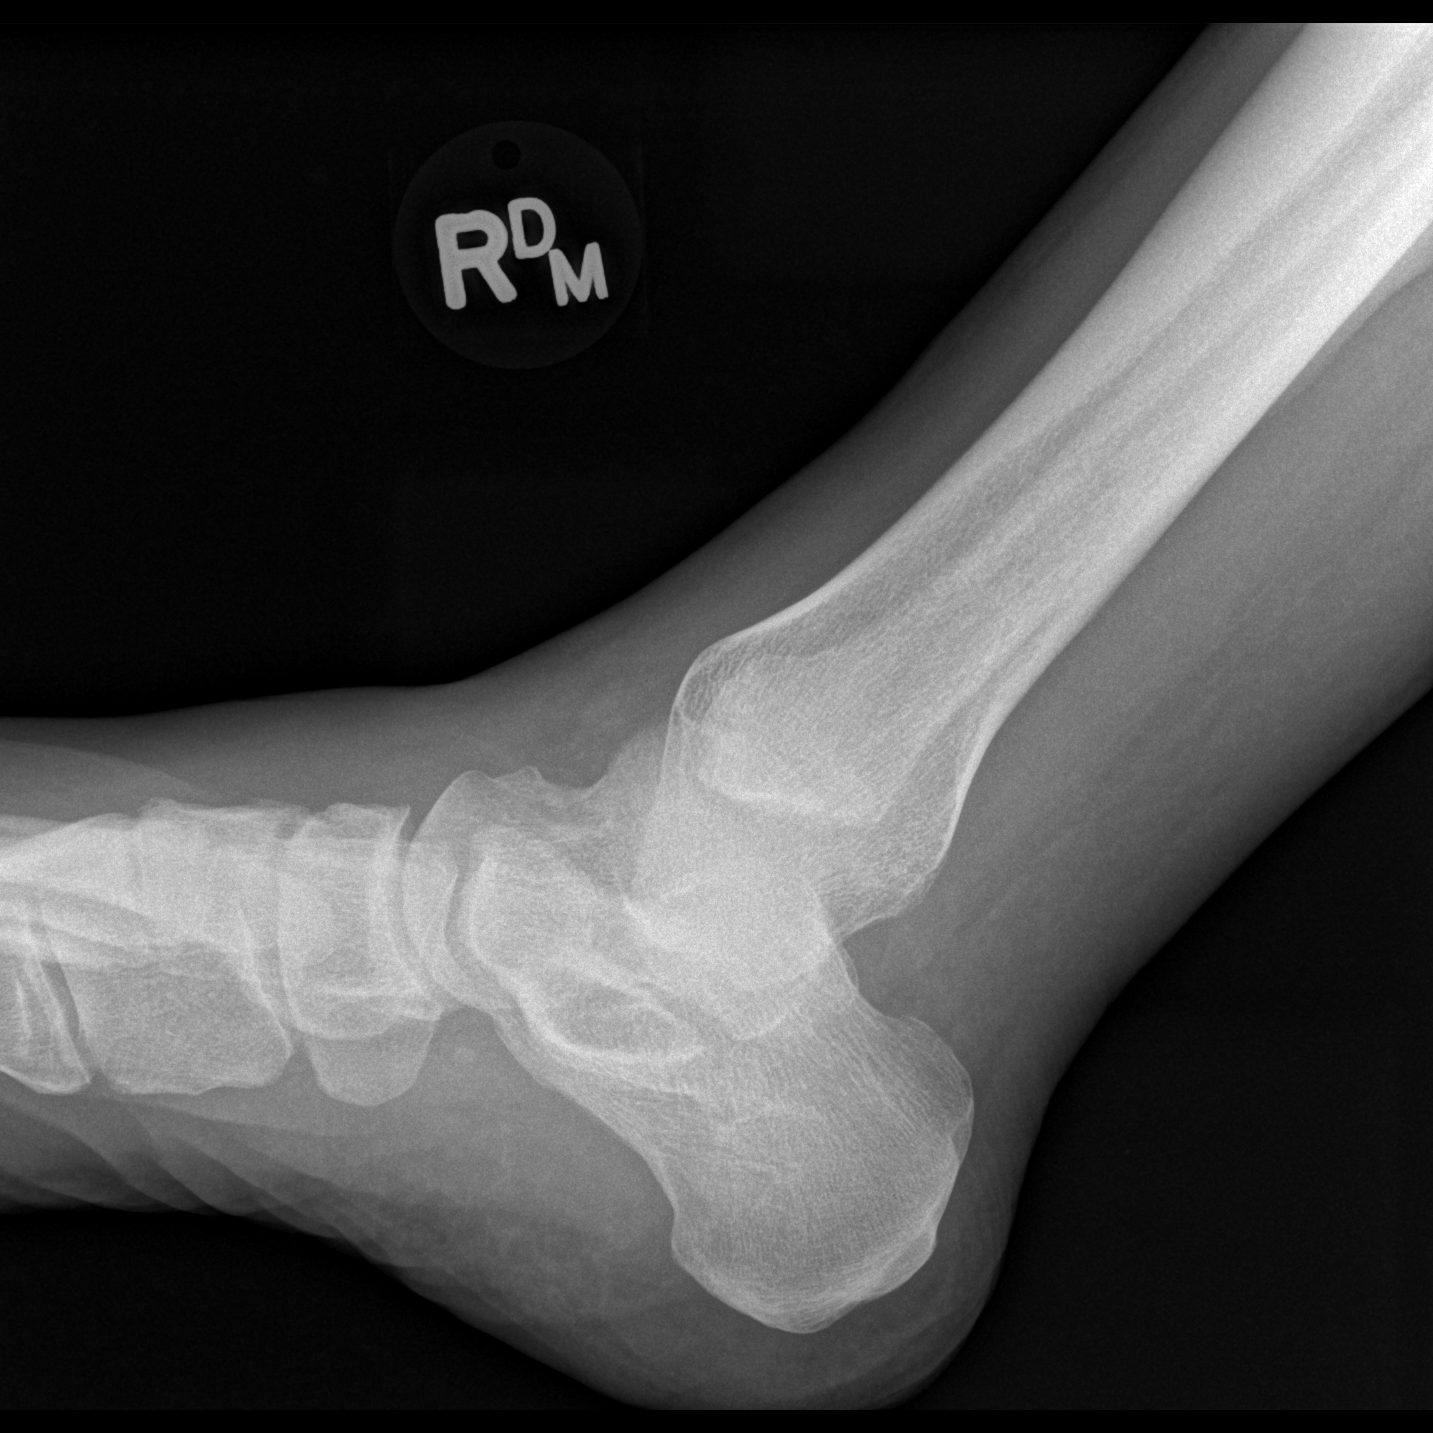

[t ankle joint ap right]
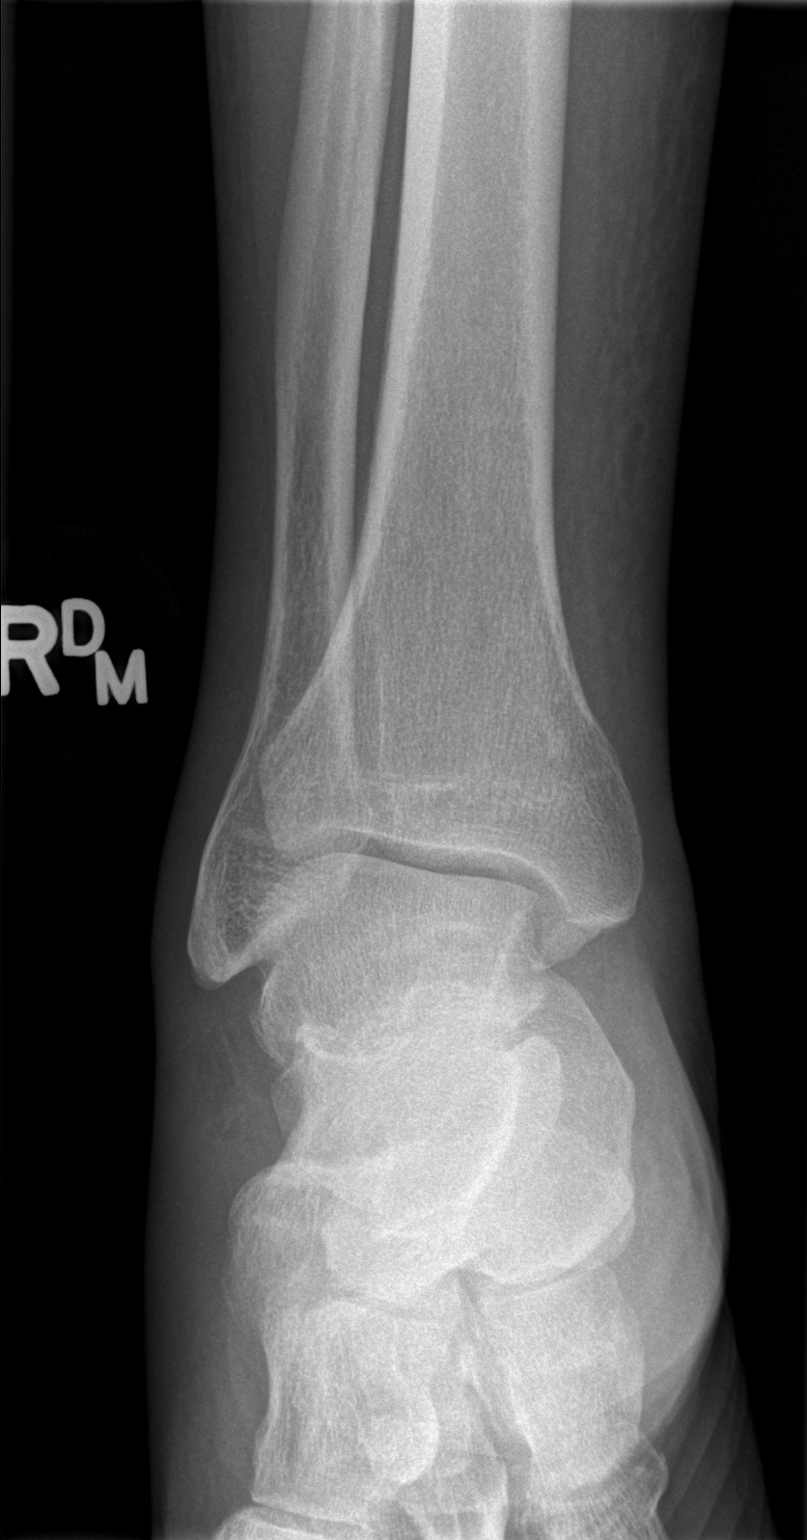

[t ankle joint oblique right]
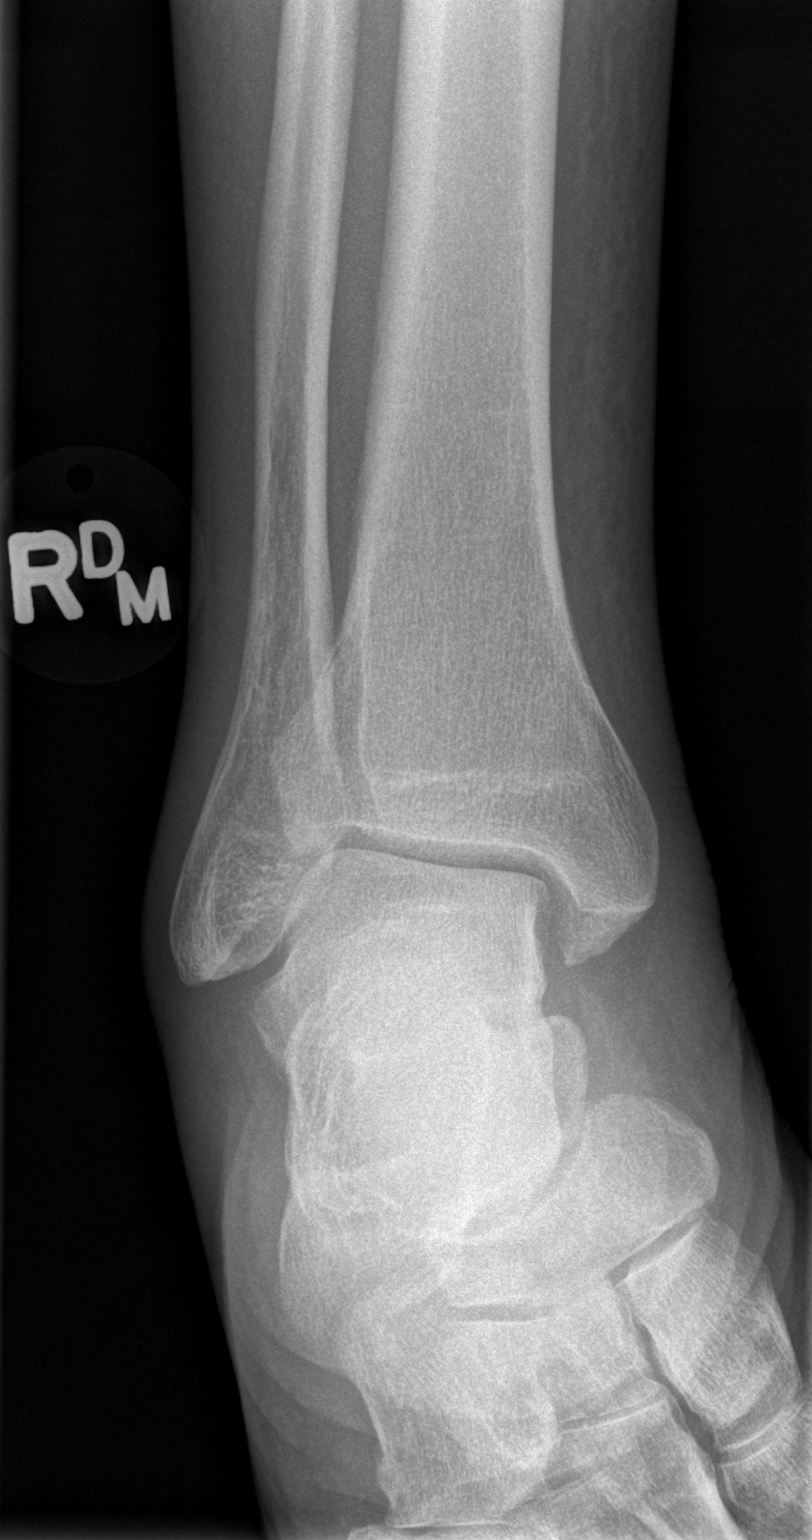

[3 of 3 positions shown; findings below may reference images not displayed]

FINDINGS: Ankle joint itself appears normal.  No evidence of
fracture.  No other focal lesion.
IMPRESSION: Negative for

## 2014-12-18 IMAGING — CR DG FOOT COMPLETE 3+V*R*
3 series · 3 of 3 positions shown · non-contrast
Comparison: None.

CLINICAL DATA: Pain and swelling for several days of

RIGHT FOOT COMPLETE - 3+ VIEW

[t foot ap right]
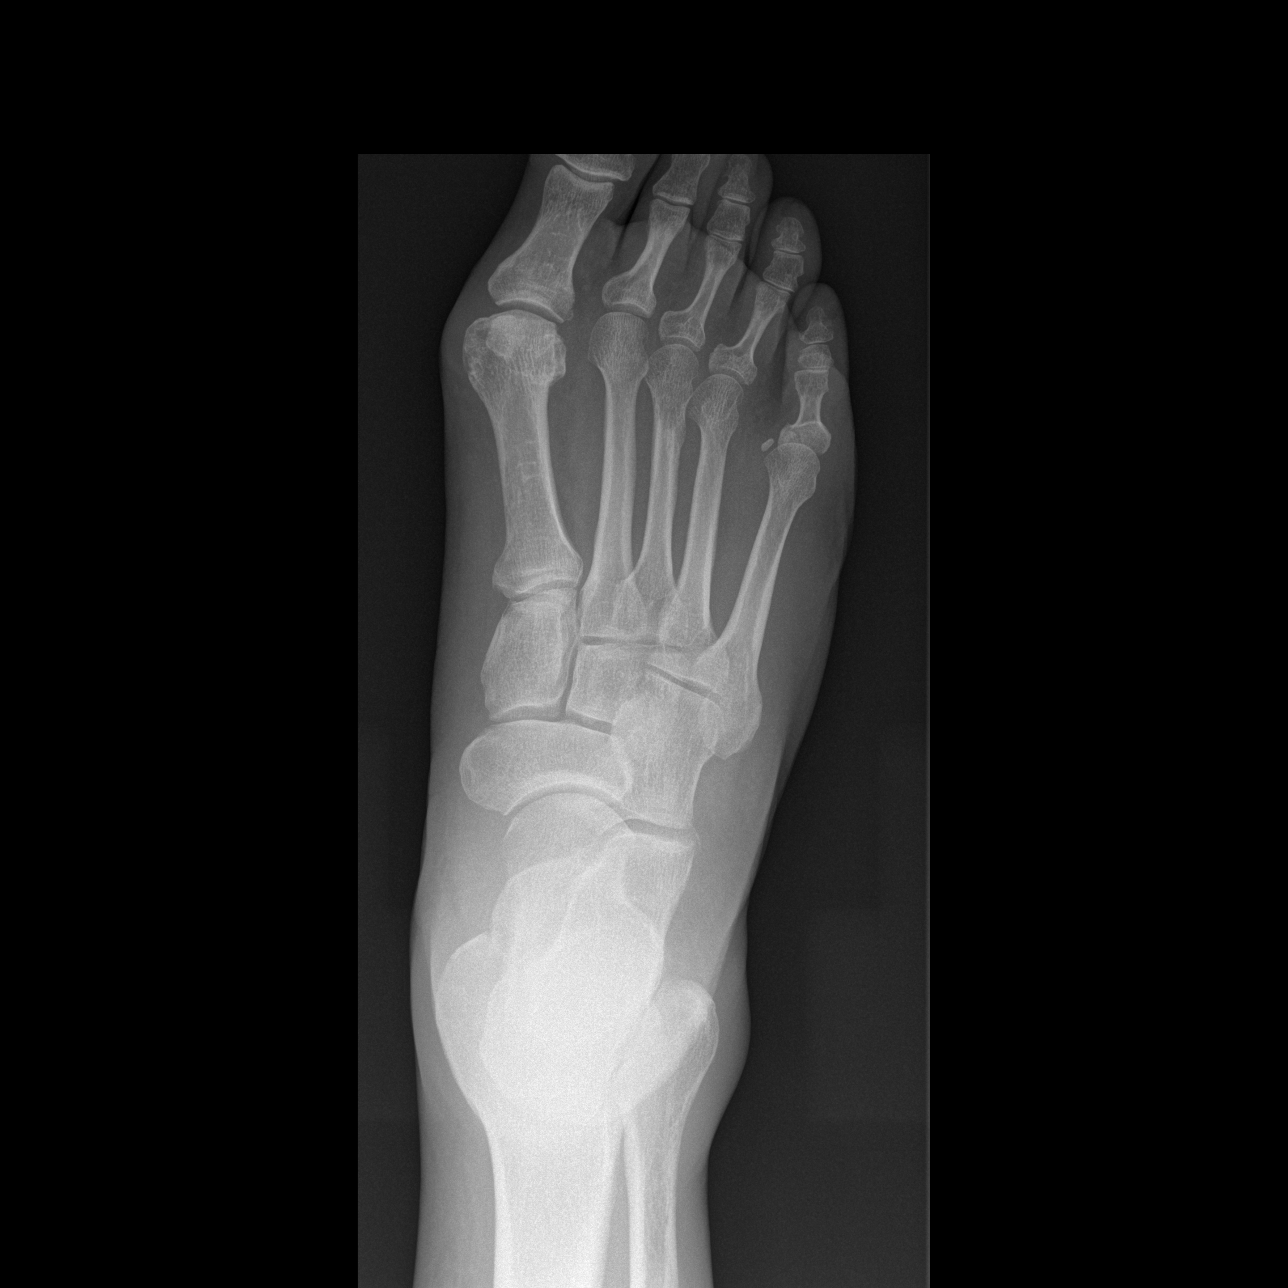

[t foot oblique right]
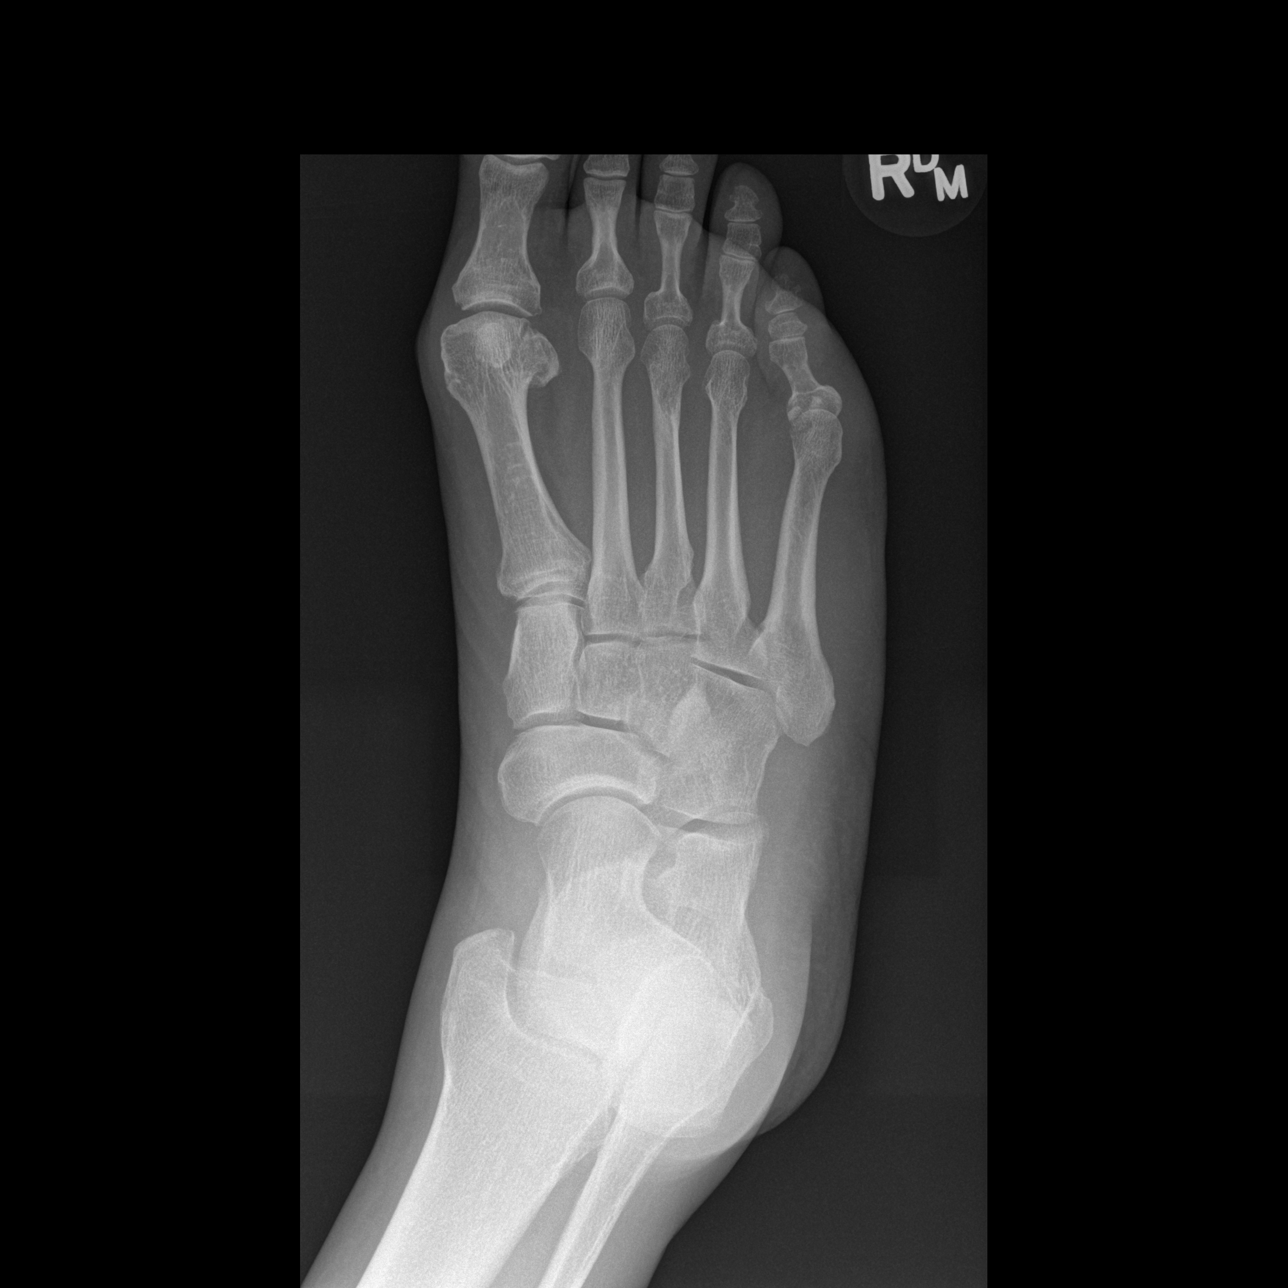

[t foot lat right]
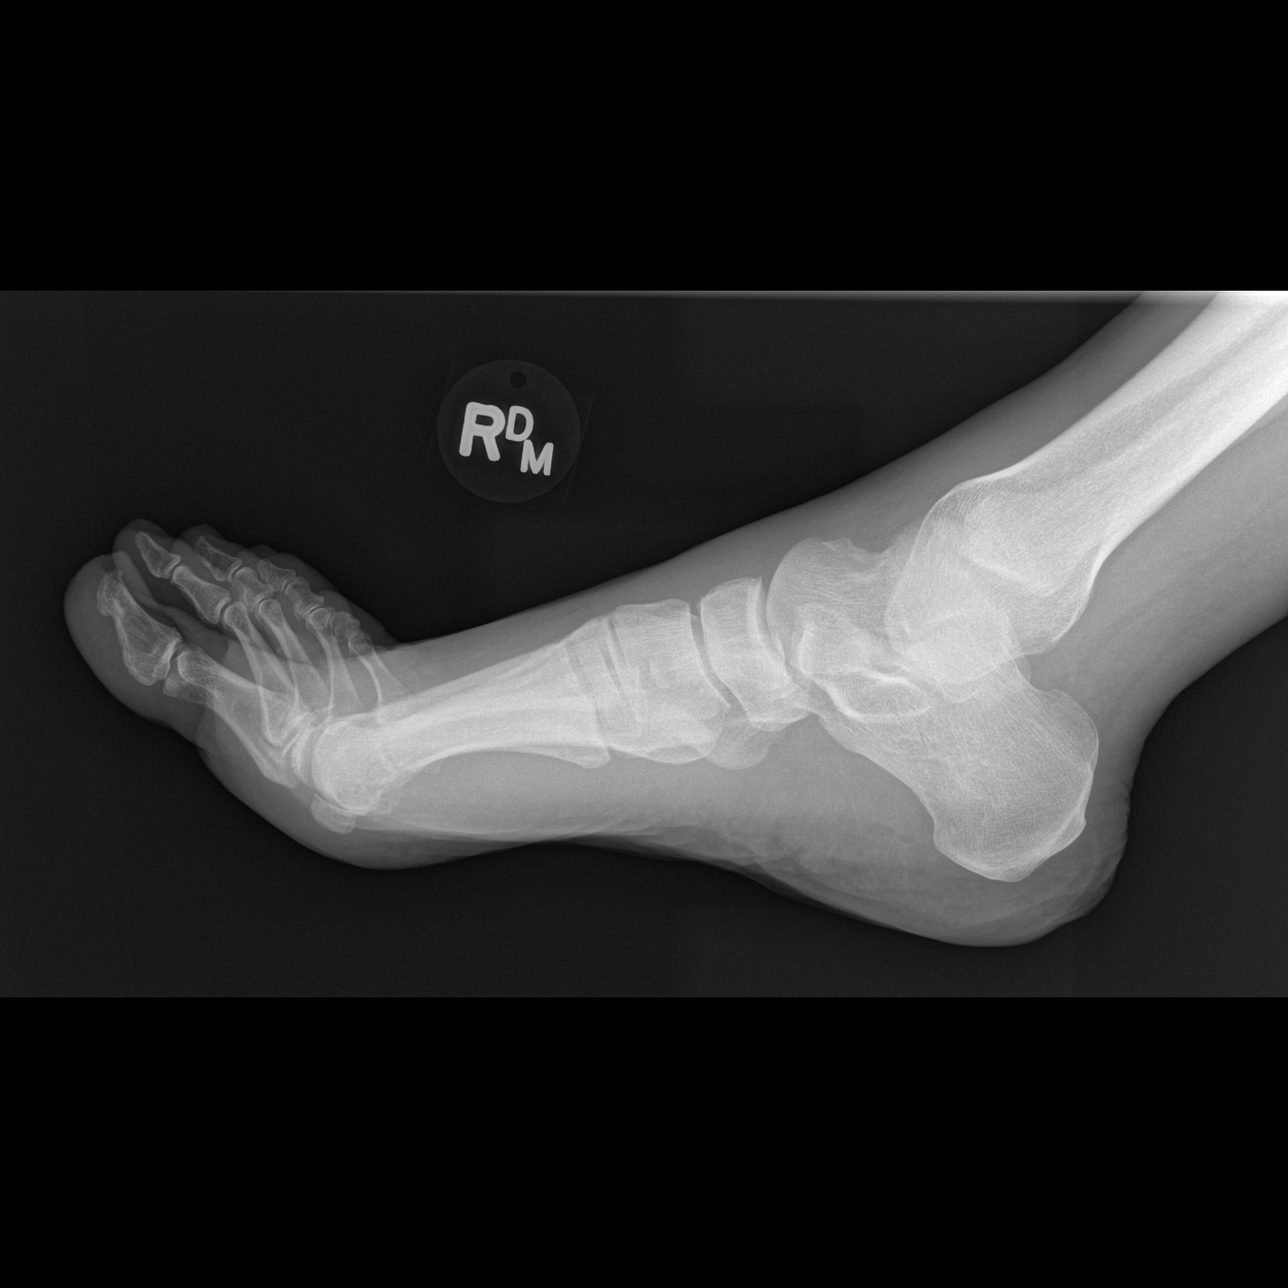

[3 of 3 positions shown; findings below may reference images not displayed]

FINDINGS: There is hallux valgus deformity with mild degenerative
change.  The other bones of the foot appear normal.  No traumatic
finding.  No evidence of erosions.
IMPRESSION: Hallux valgus deformity.  No other finding of note.

## 2014-12-23 ENCOUNTER — Encounter: Payer: Self-pay | Admitting: Oncology

## 2016-01-24 IMAGING — CR DG HIP COMPLETE 2+V*R*
3 series · 3 of 3 positions shown · non-contrast
Comparison: None.

CLINICAL DATA: Fall last night, posterior right hip pain

EXAM:
RIGHT HIP - COMPLETE 2+ VIEW

[t pelvis a.p.]
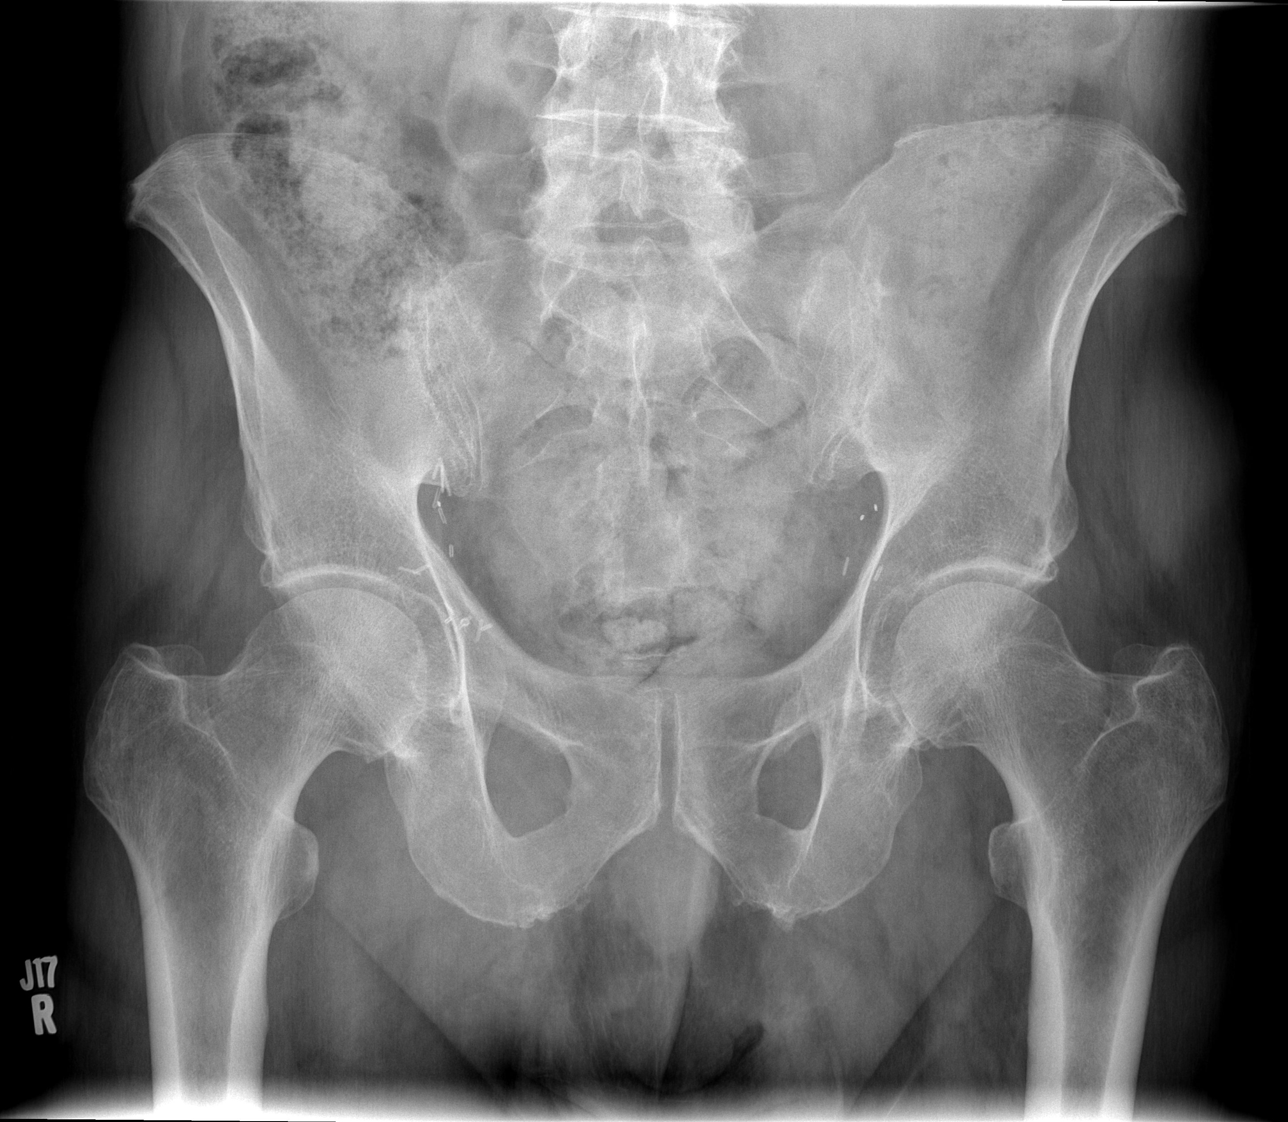

[t hip ap right]
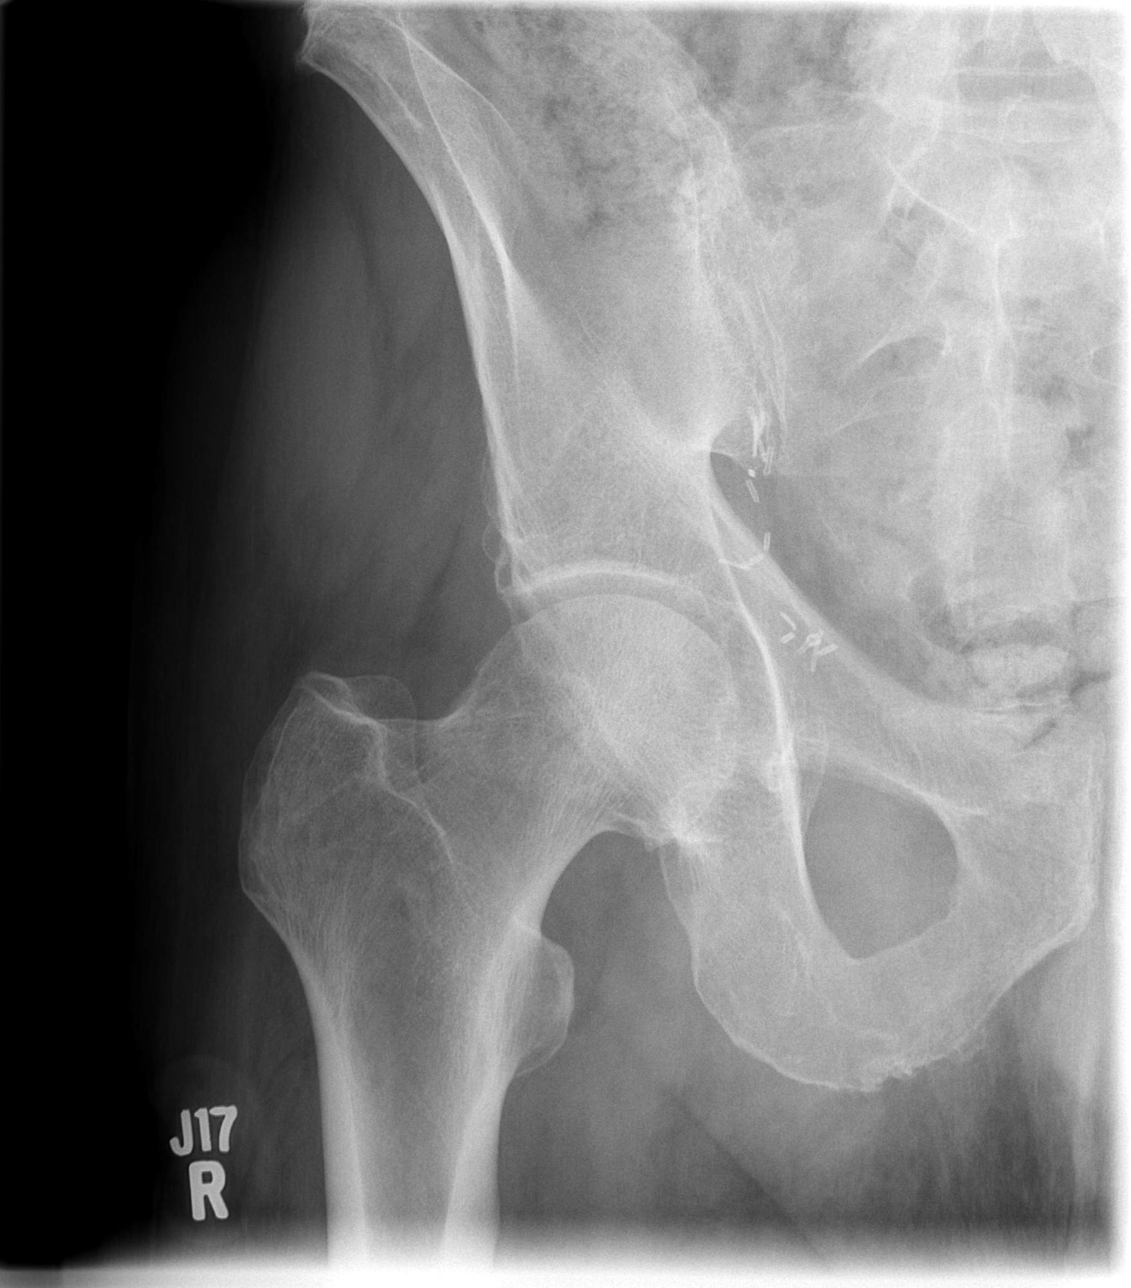

[t hip frog leg right]
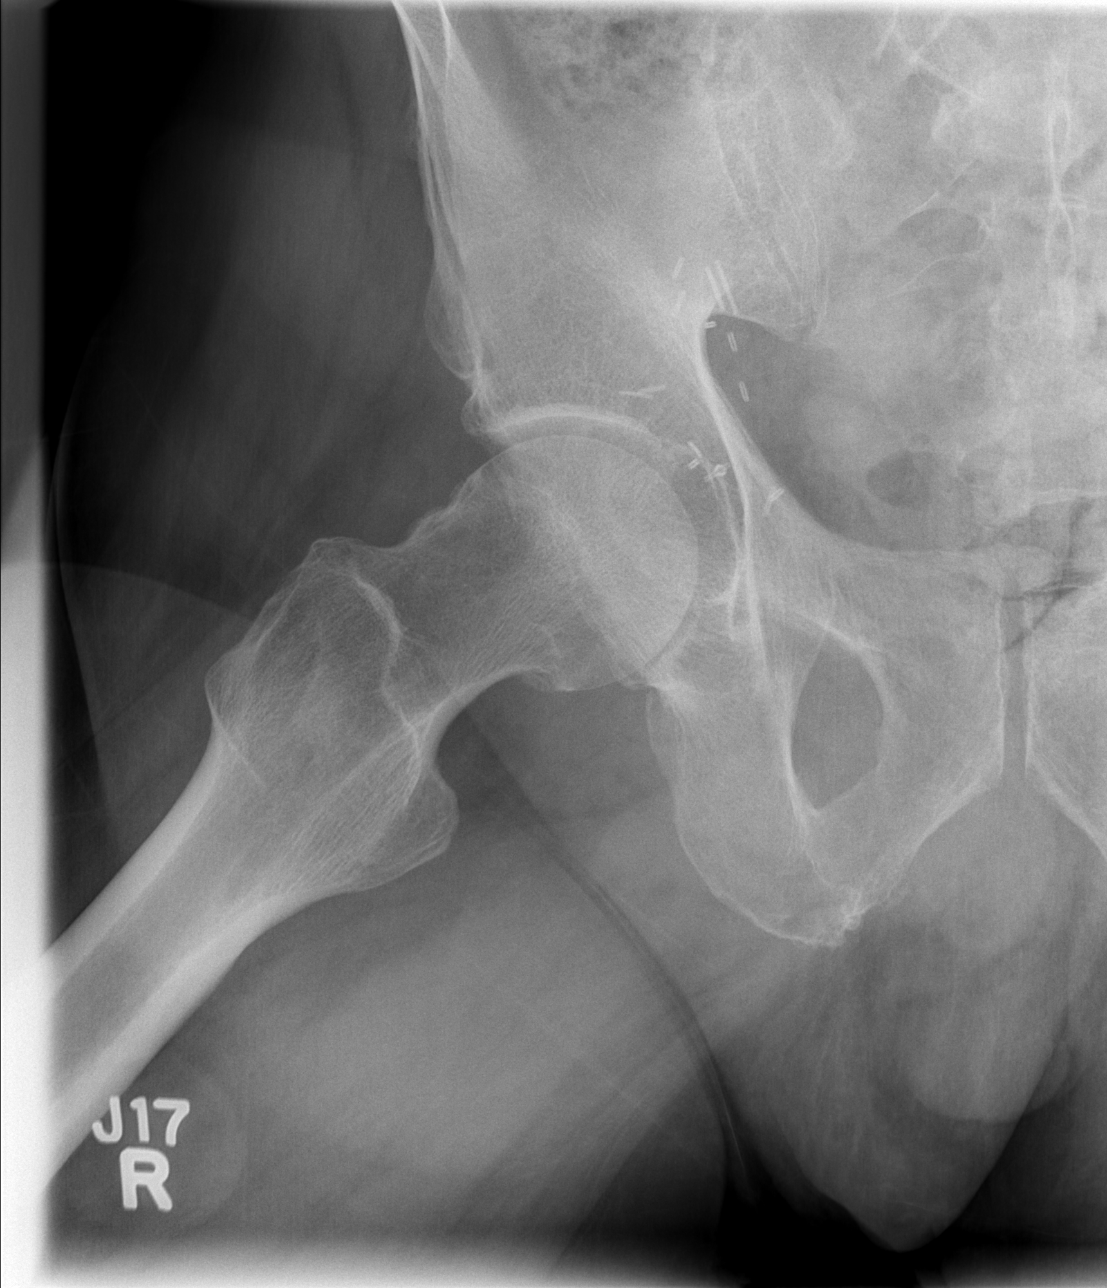

[3 of 3 positions shown; findings below may reference images not displayed]

FINDINGS: There is no acute fracture or dislocation. There is generalized
osteopenia. There is mild right hip osteoarthritis. There are
surgical clips along the pelvic sidewall bilaterally.
IMPRESSION: 1. No acute osseous injury of the right hip.
2. Mild osteoarthritis of the right hip.
